# Patient Record
Sex: Female | Born: 1985
Health system: Southern US, Community
[De-identification: ages and names within clinical notes are randomized; demographics above are authoritative.]

## PROBLEM LIST (undated history)

## (undated) DIAGNOSIS — F3181 Bipolar II disorder: Secondary | ICD-10-CM

## (undated) HISTORY — DX: Bipolar II disorder: F31.81

## (undated) HISTORY — PX: INCISION AND DRAINAGE: SHX5863

---

## 2018-06-08 ENCOUNTER — Encounter (HOSPITAL_COMMUNITY): Payer: Self-pay | Admitting: *Deleted

## 2018-06-08 ENCOUNTER — Emergency Department (HOSPITAL_COMMUNITY)
Admission: EM | Admit: 2018-06-08 | Discharge: 2018-06-08 | Disposition: A | Discharge status: Home / Self Care | Payer: Self-pay | Attending: Emergency Medicine | Admitting: Emergency Medicine

## 2018-06-08 ENCOUNTER — Other Ambulatory Visit: Payer: Self-pay

## 2018-06-08 DIAGNOSIS — L0291 Cutaneous abscess, unspecified: Secondary | ICD-10-CM

## 2018-06-08 DIAGNOSIS — L02415 Cutaneous abscess of right lower limb: Secondary | ICD-10-CM | POA: Insufficient documentation

## 2018-06-08 DIAGNOSIS — Z79899 Other long term (current) drug therapy: Secondary | ICD-10-CM | POA: Insufficient documentation

## 2018-06-08 DIAGNOSIS — F17210 Nicotine dependence, cigarettes, uncomplicated: Secondary | ICD-10-CM | POA: Insufficient documentation

## 2018-06-08 MED ORDER — DOXYCYCLINE HYCLATE 100 MG PO CAPS: 100.0000 mg | ORAL_CAPSULE | Freq: Two times a day (BID) | ORAL | 0 refills | Status: DC

## 2018-06-08 MED ORDER — LIDOCAINE-EPINEPHRINE (PF) 2 %-1:200000 IJ SOLN
20.0000 mL | Freq: Once | INTRAMUSCULAR | Status: AC
  Administered 2018-06-08: 20 mL
  Filled 2018-06-08: qty 20

## 2018-06-08 MED ORDER — OXYCODONE-ACETAMINOPHEN 5-325 MG PO TABS
1.0000 | ORAL_TABLET | Freq: Once | ORAL | Status: AC
  Administered 2018-06-08: 1 via ORAL
  Filled 2018-06-08: qty 1

## 2018-06-08 MED ORDER — DOXYCYCLINE HYCLATE 100 MG PO TABS
100.0000 mg | ORAL_TABLET | Freq: Once | ORAL | Status: AC
  Administered 2018-06-08: 100 mg via ORAL
  Filled 2018-06-08: qty 1

## 2018-06-08 NOTE — ED Provider Notes (Signed)
[facility] EMERGENCY DEPARTMENT Provider Note   CSN: [id number] Arrival date & time: 06/08/18  1256   History   Chief Complaint Chief Complaint  Patient presents with  . Abscess    HPI Tiffany Merritt is a 32 y.o. female.  HPI   32 year old female presents today with abscess.Patient notes over the last week she's developed an abscessto her right inner thigh. She notes a history of the same but never in this location. She notes numerous episodes of I&D of her breast for abscess. She denies any fever, reports she has had some drainage from the area. She denies any history of MRSA, no history of history diabetes.    History reviewed. No pertinent past medical history.  There are no active problems to display for this patient.   History reviewed. No pertinent surgical history.   OB History   None      Home Medications    Prior to Admission medications   Medication Sig Start Date End Date Taking? Authorizing Provider  naproxen sodium (ALEVE) 220 MG tablet Take 220 mg by mouth daily as needed (PAIN).   Yes [provider]  doxycycline (VIBRAMYCIN) 100 MG capsule Take 1 capsule (100 mg total) by mouth 2 (two) times daily. 06/08/18   Eyvonne Mechanic, PA-C    Family History History reviewed. No pertinent family history.  Social History Social History   Tobacco Use  . Smoking status: Current Every Day Smoker  Substance Use Topics  . Alcohol use: Yes    Comment: occ  . Drug use: Never     Allergies   Patient has no known allergies.   Review of Systems Review of Systems  All other systems reviewed and are negative.   Physical Exam Updated Vital Signs BP 119/77 (BP Location: Right Arm)   Pulse 77   Temp 98.1 F (36.7 C) (Oral)   Resp 17   SpO2 99%   Physical Exam  Constitutional: She is oriented to person, place, and time. She appears well-developed and well-nourished.  HENT:  Head: Normocephalic and atraumatic.  Eyes:  Pupils are equal, round, and reactive to light. Conjunctivae are normal. Right eye exhibits no discharge. Left eye exhibits no discharge. No scleral icterus.  Neck: Normal range of motion. No JVD present. No tracheal deviation present.  Pulmonary/Chest: Effort normal. No stridor.  Neurological: She is alert and oriented to person, place, and time. Coordination normal.  Skin:  2 cm abscess to right inner thigh- overlying redness no surrounding redness, small amount of drainage noted  Psychiatric: She has a normal mood and affect. Her behavior is normal. Judgment and thought content normal.  Nursing note and vitals reviewed.   ED Treatments / Results  Labs (all labs ordered are listed, but only abnormal results are displayed) Labs Reviewed - No data to display  EKG None  Radiology No results found.  Procedures .Marland KitchenIncision and Drainage Date/Time: 06/08/2018 6:18 PM Performed by: Eyvonne Mechanic, PA-C Authorized by: Eyvonne Mechanic, PA-C   Consent:    Consent obtained:  Verbal   Consent given by:  Patient   Risks discussed:  Bleeding, incomplete drainage, damage to other organs, infection and pain   Alternatives discussed:  No treatment Location:    Type:  Abscess   Size:  2   Location: right inner thigh  Anesthesia (see MAR for exact dosages):    Anesthesia method:  Local infiltration   Local anesthetic:  Lidocaine 2% WITH epi Procedure type:  Complexity:  Simple Procedure details:    Incision types:  Single straight   Incision depth:  Dermal   Scalpel blade:  11   Wound management:  Probed and deloculated and irrigated with saline   Drainage:  Purulent   Drainage amount:  Moderate   Wound treatment:  Wound left open   Packing materials:  None Post-procedure details:    Patient tolerance of procedure:  Tolerated well, no immediate complications   (including critical care time)  Medications Ordered in ED Medications  oxyCODONE-acetaminophen (PERCOCET/ROXICET)  5-325 MG per tablet 1 tablet (1 tablet Oral Given 06/08/18 1620)  lidocaine-EPINEPHrine (XYLOCAINE W/EPI) 2 %-1:200000 (PF) injection 20 mL (20 mLs Infiltration Given 06/08/18 1620)  doxycycline (VIBRA-TABS) tablet 100 mg (100 mg Oral Given 06/08/18 1742)     Initial Impression / Assessment and Plan / ED Course  I have reviewed the triage vital signs and the nursing notes.  Pertinent labs & imaging results that were available during my care of the patient were reviewed by me and considered in my medical decision making (see chart for details).     Labs:   Imaging:  Consults:  Therapeutics:doxycycline   Discharge Meds:  doxycycline  Assessment/Plan: 32 year old female presents today with abscess to her right inner thigh. Getting features, placed on antibiotics, instructed her skin. Patient verbalized understanding and agreement to today's plan.      Final Clinical Impressions(s) / ED Diagnoses   Final diagnoses:  Abscess    ED Discharge Orders        Ordered    doxycycline (VIBRAMYCIN) 100 MG capsule  2 times daily     06/08/18 1731       Zyrah Wiswell, Josie DixonJeffrey, PA-C 06/08/18 1821    Linwood DibblesKnapp, Jon, MD 06/08/18 2056

## 2018-06-08 NOTE — ED Triage Notes (Signed)
Pt reports having a large abscess to her inner thigh for several days. Denies fever but reports drainage and nausea.

## 2018-06-08 NOTE — Discharge Instructions (Addendum)
Please read attached information. If you experience any new or worsening signs or symptoms please return to the emergency room for evaluation. Please follow-up with your primary care provider or specialist as discussed. Please use medication prescribed only as directed and discontinue taking if you have any concerning signs or symptoms.   °

## 2018-09-16 DIAGNOSIS — R091 Pleurisy: Secondary | ICD-10-CM | POA: Diagnosis not present

## 2018-09-16 DIAGNOSIS — R05 Cough: Secondary | ICD-10-CM | POA: Diagnosis not present

## 2018-09-16 DIAGNOSIS — J189 Pneumonia, unspecified organism: Secondary | ICD-10-CM | POA: Diagnosis not present

## 2018-09-16 DIAGNOSIS — R062 Wheezing: Secondary | ICD-10-CM | POA: Diagnosis not present

## 2018-09-19 ENCOUNTER — Other Ambulatory Visit: Payer: Self-pay

## 2018-09-19 ENCOUNTER — Emergency Department (HOSPITAL_BASED_OUTPATIENT_CLINIC_OR_DEPARTMENT_OTHER)
Admission: EM | Admit: 2018-09-19 | Discharge: 2018-09-19 | Disposition: A | Discharge status: Home / Self Care | Payer: 59 | Attending: Emergency Medicine | Admitting: Emergency Medicine

## 2018-09-19 ENCOUNTER — Emergency Department (HOSPITAL_BASED_OUTPATIENT_CLINIC_OR_DEPARTMENT_OTHER): Payer: 59

## 2018-09-19 ENCOUNTER — Encounter (HOSPITAL_BASED_OUTPATIENT_CLINIC_OR_DEPARTMENT_OTHER): Payer: Self-pay | Admitting: *Deleted

## 2018-09-19 DIAGNOSIS — B9789 Other viral agents as the cause of diseases classified elsewhere: Secondary | ICD-10-CM

## 2018-09-19 DIAGNOSIS — F17200 Nicotine dependence, unspecified, uncomplicated: Secondary | ICD-10-CM | POA: Diagnosis not present

## 2018-09-19 DIAGNOSIS — J069 Acute upper respiratory infection, unspecified: Secondary | ICD-10-CM | POA: Diagnosis not present

## 2018-09-19 DIAGNOSIS — R05 Cough: Secondary | ICD-10-CM | POA: Diagnosis not present

## 2018-09-19 DIAGNOSIS — R091 Pleurisy: Secondary | ICD-10-CM | POA: Diagnosis not present

## 2018-09-19 DIAGNOSIS — J189 Pneumonia, unspecified organism: Secondary | ICD-10-CM | POA: Diagnosis not present

## 2018-09-19 DIAGNOSIS — R062 Wheezing: Secondary | ICD-10-CM | POA: Diagnosis not present

## 2018-09-19 MED ORDER — ACETAMINOPHEN 325 MG PO TABS
650.0000 mg | ORAL_TABLET | Freq: Once | ORAL | Status: AC
  Administered 2018-09-19: 650 mg via ORAL
  Filled 2018-09-19: qty 2

## 2018-09-19 NOTE — ED Notes (Signed)
Patient transported to X-ray 

## 2018-09-19 NOTE — ED Triage Notes (Signed)
Pain in her chest for a week. She was diagnosed with pneumonia 3 days ago. [facility] went [facility] to [facility] today for a recheck and was told to come here because she did not sound better. She is ambulatory.

## 2018-09-19 NOTE — ED Notes (Signed)
ED Provider at bedside. 

## 2018-09-19 NOTE — ED Provider Notes (Signed)
[facility] EMERGENCY DEPARTMENT Provider Note   CSN: [id number] Arrival date & time: 09/19/18  1642     History   Chief Complaint Chief Complaint  Patient presents with  . Cough    HPI Tiffany Merritt is a 32 y.o. female.  The history is provided by the patient.  Cough  This is a new problem. The current episode started more than 2 days ago. The problem occurs constantly. The problem has not changed since onset.The cough is non-productive. There has been no fever. The fever has been present for less than 1 day. Associated symptoms include chills and myalgias. Pertinent negatives include no chest pain, no weight loss, no ear congestion, no ear pain, no headaches, no rhinorrhea, no sore throat, no shortness of breath, no wheezing and no eye redness. She has tried decongestants (zpak) for the symptoms. The treatment provided mild relief. Her past medical history is significant for pneumonia (on abx for pneumonia). Her past medical history does not include COPD or asthma.    History reviewed. No pertinent past medical history.  There are no active problems to display for this patient.   History reviewed. No pertinent surgical history.   OB History   None      Home Medications    Prior to Admission medications   Medication Sig Start Date End Date Taking? Authorizing Provider  albuterol (PROVENTIL HFA;VENTOLIN HFA) 108 (90 Base) MCG/ACT inhaler Inhale into the lungs every 6 (six) hours as needed for wheezing or shortness of breath.   Yes [provider]  azithromycin (ZITHROMAX) 250 MG tablet Take by mouth daily.   Yes [provider]  predniSONE (DELTASONE) 10 MG tablet Take 10 mg by mouth daily with breakfast.   Yes [provider]  doxycycline (VIBRAMYCIN) 100 MG capsule Take 1 capsule (100 mg total) by mouth 2 (two) times daily. 06/08/18   Hedges, Tinnie Gens, PA-C  naproxen sodium (ALEVE) 220 MG tablet Take 220 mg by mouth daily as needed  (PAIN).    [provider]    Family History No family history on file.  Social History Social History   Tobacco Use  . Smoking status: Current Every Day Smoker  . Smokeless tobacco: Never Used  Substance Use Topics  . Alcohol use: Yes    Comment: occ  . Drug use: Never     Allergies   Patient has no known allergies.   Review of Systems Review of Systems  Constitutional: Positive for chills. Negative for fever and weight loss.  HENT: Negative for ear pain, rhinorrhea and sore throat.   Eyes: Negative for pain, redness and visual disturbance.  Respiratory: Positive for cough. Negative for shortness of breath and wheezing.   Cardiovascular: Negative for chest pain and palpitations.  Gastrointestinal: Negative for abdominal pain and vomiting.  Genitourinary: Negative for dysuria and hematuria.  Musculoskeletal: Positive for myalgias. Negative for arthralgias and back pain.  Skin: Negative for color change and rash.  Neurological: Negative for seizures, syncope and headaches.  All other systems reviewed and are negative.    Physical Exam Updated Vital Signs BP 135/84   Pulse 90   Temp 98.4 F (36.9 C) (Oral)   Resp 20   Ht 5\' 4"  (1.626 m)   Wt 79.8 kg   LMP 08/19/2018   SpO2 97%   BMI 30.21 kg/m   Physical Exam  Constitutional: She is oriented to person, place, and time. She appears well-developed and well-nourished. No distress.  HENT:  Head: Normocephalic and atraumatic.  Eyes: Pupils are equal, round, and reactive to light. Conjunctivae and EOM are normal.  Neck: Normal range of motion. Neck supple.  Cardiovascular: Normal rate, regular rhythm, normal heart sounds and intact distal pulses.  No murmur heard. Pulmonary/Chest: Effort normal and breath sounds normal. No stridor. No respiratory distress. She has no wheezes.  Abdominal: Soft. There is no tenderness.  Musculoskeletal: Normal range of motion. She exhibits no edema.  Neurological: She is  alert and oriented to person, place, and time.  Skin: Skin is warm and dry.  Psychiatric: She has a normal mood and affect.  Nursing note and vitals reviewed.    ED Treatments / Results  Labs (all labs ordered are listed, but only abnormal results are displayed) Labs Reviewed - No data to display  EKG None  Radiology Dg Chest 2 View  Result Date: 09/19/2018 CLINICAL DATA:  32 year old diagnosed with pneumonia 3 days ago, presenting with worsening cough and chest tightness. Current smoker. EXAM: CHEST - 2 VIEW COMPARISON:  None. FINDINGS: Cardiomediastinal silhouette unremarkable. Lungs clear. Bronchovascular markings normal. Pulmonary vascularity normal. No visible pleural effusions. No pneumothorax. Visualized bony thorax intact. IMPRESSION: Normal examination. Electronically Signed   By: Hulan Saas M.D.   On: 09/19/2018 17:56    Procedures Procedures (including critical care time)  Medications Ordered in ED Medications  acetaminophen (TYLENOL) tablet 650 mg (650 mg Oral Given 09/19/18 1737)     Initial Impression / Assessment and Plan / ED Course  I have reviewed the triage vital signs and the nursing notes.  Pertinent labs & imaging results that were available during my care of the patient were reviewed by me and considered in my medical decision making (see chart for details).     Tiffany Merritt is a 32 year old female with no significant medical history who presents to the ED with cough.  Patient with normal vitals.  No fever.  Patient currently on a Zithromax for pneumonia.  Continues to have cough.  Has been on antibiotics for about 3 days.  Still feels some body aches.  Repeat chest x-ray showed no signs of pneumonia.  No pneumothorax, no pleural effusion.  Patient is overall well-appearing.  Likely ongoing viral process, resolving pneumonia. Recommend continuation of antibiotics, Tylenol, Motrin.  Discharged from ED in good condition.  Understands return  precautions.  Given information for PCP follow-up.  This chart was dictated using voice recognition software.  Despite best efforts to proofread,  errors can occur which can change the documentation meaning.   Final Clinical Impressions(s) / ED Diagnoses   Final diagnoses:  Viral URI with cough    ED Discharge Orders    None       Virgina Norfolk, DO 09/19/18 1819

## 2018-09-20 ENCOUNTER — Encounter (HOSPITAL_COMMUNITY): Payer: Self-pay

## 2018-09-20 ENCOUNTER — Observation Stay (HOSPITAL_COMMUNITY)
Admission: EM | Admit: 2018-09-20 | Discharge: 2018-09-21 | Disposition: A | Discharge status: Home / Self Care | Payer: 59 | Attending: Internal Medicine | Admitting: Internal Medicine

## 2018-09-20 ENCOUNTER — Emergency Department (HOSPITAL_COMMUNITY): Payer: 59

## 2018-09-20 ENCOUNTER — Other Ambulatory Visit: Payer: Self-pay

## 2018-09-20 DIAGNOSIS — F17200 Nicotine dependence, unspecified, uncomplicated: Secondary | ICD-10-CM | POA: Diagnosis not present

## 2018-09-20 DIAGNOSIS — Z79899 Other long term (current) drug therapy: Secondary | ICD-10-CM | POA: Diagnosis not present

## 2018-09-20 DIAGNOSIS — E876 Hypokalemia: Secondary | ICD-10-CM | POA: Insufficient documentation

## 2018-09-20 DIAGNOSIS — I2699 Other pulmonary embolism without acute cor pulmonale: Secondary | ICD-10-CM | POA: Diagnosis not present

## 2018-09-20 DIAGNOSIS — R0781 Pleurodynia: Secondary | ICD-10-CM | POA: Insufficient documentation

## 2018-09-20 DIAGNOSIS — R0902 Hypoxemia: Secondary | ICD-10-CM | POA: Diagnosis not present

## 2018-09-20 DIAGNOSIS — H5319 Other subjective visual disturbances: Secondary | ICD-10-CM

## 2018-09-20 DIAGNOSIS — L818 Other specified disorders of pigmentation: Secondary | ICD-10-CM

## 2018-09-20 DIAGNOSIS — R918 Other nonspecific abnormal finding of lung field: Secondary | ICD-10-CM | POA: Diagnosis not present

## 2018-09-20 DIAGNOSIS — Z720 Tobacco use: Secondary | ICD-10-CM | POA: Diagnosis not present

## 2018-09-20 DIAGNOSIS — D72829 Elevated white blood cell count, unspecified: Secondary | ICD-10-CM | POA: Diagnosis not present

## 2018-09-20 DIAGNOSIS — H538 Other visual disturbances: Secondary | ICD-10-CM

## 2018-09-20 DIAGNOSIS — R202 Paresthesia of skin: Secondary | ICD-10-CM

## 2018-09-20 DIAGNOSIS — I2694 Multiple subsegmental pulmonary emboli without acute cor pulmonale: Secondary | ICD-10-CM | POA: Diagnosis not present

## 2018-09-20 DIAGNOSIS — R000 Tachycardia, unspecified: Secondary | ICD-10-CM | POA: Insufficient documentation

## 2018-09-20 DIAGNOSIS — Z975 Presence of (intrauterine) contraceptive device: Secondary | ICD-10-CM | POA: Insufficient documentation

## 2018-09-20 DIAGNOSIS — Z7901 Long term (current) use of anticoagulants: Secondary | ICD-10-CM | POA: Diagnosis not present

## 2018-09-20 DIAGNOSIS — R079 Chest pain, unspecified: Secondary | ICD-10-CM | POA: Diagnosis not present

## 2018-09-20 DIAGNOSIS — R0789 Other chest pain: Secondary | ICD-10-CM | POA: Diagnosis not present

## 2018-09-20 DIAGNOSIS — I10 Essential (primary) hypertension: Secondary | ICD-10-CM | POA: Diagnosis not present

## 2018-09-20 LAB — CBC
HEMATOCRIT: 45.9 % (ref 36.0–46.0)
Hemoglobin: 15 g/dL (ref 12.0–15.0)
MCH: 30.2 pg (ref 26.0–34.0)
MCHC: 32.7 g/dL (ref 30.0–36.0)
MCV: 92.4 fL (ref 80.0–100.0)
PLATELETS: 223 10*3/uL (ref 150–400)
RBC: 4.97 MIL/uL (ref 3.87–5.11)
RDW: 11.6 % (ref 11.5–15.5)
WBC: 15.7 10*3/uL — AB (ref 4.0–10.5)
nRBC: 0 % (ref 0.0–0.2)

## 2018-09-20 LAB — COMPREHENSIVE METABOLIC PANEL
ALT: 18 U/L (ref 0–44)
ANION GAP: 11 (ref 5–15)
AST: 16 U/L (ref 15–41)
Albumin: 3.8 g/dL (ref 3.5–5.0)
Alkaline Phosphatase: 57 U/L (ref 38–126)
BUN: 7 mg/dL (ref 6–20)
CO2: 26 mmol/L (ref 22–32)
Calcium: 9.4 mg/dL (ref 8.9–10.3)
Chloride: 102 mmol/L (ref 98–111)
Creatinine, Ser: 0.68 mg/dL (ref 0.44–1.00)
Glucose, Bld: 103 mg/dL — ABNORMAL HIGH (ref 70–99)
POTASSIUM: 3.1 mmol/L — AB (ref 3.5–5.1)
Sodium: 139 mmol/L (ref 135–145)
TOTAL PROTEIN: 7.4 g/dL (ref 6.5–8.1)
Total Bilirubin: 0.6 mg/dL (ref 0.3–1.2)

## 2018-09-20 LAB — I-STAT TROPONIN, ED: TROPONIN I, POC: 0 ng/mL (ref 0.00–0.08)

## 2018-09-20 MED ORDER — RIVAROXABAN 20 MG PO TABS: 20.0000 mg | ORAL_TABLET | Freq: Every day | ORAL | Status: DC

## 2018-09-20 MED ORDER — HEPARIN (PORCINE) IN NACL 100-0.45 UNIT/ML-% IJ SOLN
1300.0000 [IU]/h | INTRAMUSCULAR | Status: AC
  Administered 2018-09-20: 1300 [IU]/h via INTRAVENOUS
  Filled 2018-09-20: qty 250

## 2018-09-20 MED ORDER — IOPAMIDOL (ISOVUE-370) INJECTION 76%
100.0000 mL | Freq: Once | INTRAVENOUS | Status: AC | PRN
  Administered 2018-09-20: 100 mL via INTRAVENOUS

## 2018-09-20 MED ORDER — ONDANSETRON HCL 4 MG/2ML IJ SOLN
4.0000 mg | Freq: Once | INTRAMUSCULAR | Status: AC
  Administered 2018-09-20: 4 mg via INTRAVENOUS
  Filled 2018-09-20: qty 2

## 2018-09-20 MED ORDER — POTASSIUM CHLORIDE CRYS ER 20 MEQ PO TBCR
40.0000 meq | EXTENDED_RELEASE_TABLET | ORAL | Status: AC
  Administered 2018-09-20 – 2018-09-21 (×2): 40 meq via ORAL
  Filled 2018-09-20 (×2): qty 2

## 2018-09-20 MED ORDER — IOPAMIDOL (ISOVUE-370) INJECTION 76%
INTRAVENOUS | Status: AC
  Filled 2018-09-20: qty 100

## 2018-09-20 MED ORDER — KETOROLAC TROMETHAMINE 30 MG/ML IJ SOLN
30.0000 mg | Freq: Four times a day (QID) | INTRAMUSCULAR | Status: DC | PRN
  Administered 2018-09-20: 30 mg via INTRAVENOUS
  Filled 2018-09-20: qty 1

## 2018-09-20 MED ORDER — HEPARIN BOLUS VIA INFUSION
4500.0000 [IU] | Freq: Once | INTRAVENOUS | Status: AC
  Administered 2018-09-20: 4500 [IU] via INTRAVENOUS
  Filled 2018-09-20: qty 4500

## 2018-09-20 MED ORDER — FENTANYL CITRATE (PF) 100 MCG/2ML IJ SOLN
50.0000 ug | Freq: Once | INTRAMUSCULAR | Status: AC
  Administered 2018-09-20: 50 ug via INTRAVENOUS
  Filled 2018-09-20: qty 2

## 2018-09-20 MED ORDER — SODIUM CHLORIDE 0.9% FLUSH: 3.0000 mL | Freq: Two times a day (BID) | INTRAVENOUS | Status: DC

## 2018-09-20 MED ORDER — RIVAROXABAN 15 MG PO TABS
15.0000 mg | ORAL_TABLET | Freq: Two times a day (BID) | ORAL | Status: DC
  Administered 2018-09-21: 15 mg via ORAL
  Filled 2018-09-20 (×2): qty 1

## 2018-09-20 NOTE — Progress Notes (Signed)
   09/20/18 2229  Vitals  Temp 97.8 F (36.6 C)  Temp Source Oral  BP 123/79  BP Location Right Arm  Pulse Rate 82  Pulse Rate Source Dinamap  Resp 18  Oxygen Therapy  SpO2 98 %  O2 Device Room Air  Height and Weight  Height 5\' 4"  (1.626 m)  Weight 82.2 kg ( scale a)  Type of Scale Used Standing  BSA (Calculated - sq m) 1.93 sq meters  BMI (Calculated) 31.09  Weight in (lb) to have BMI = 25 145.3  Admitted pt to rm 3E07 from ED, pt alert and oriented, placed on cardiac monitor, CCMD made aware, oriented to room, call bell placed within reach.

## 2018-09-20 NOTE — ED Provider Notes (Signed)
[facility] EMERGENCY DEPARTMENT Provider Note   CSN: [id number] Arrival date & time: 09/20/18  1421     History   Chief Complaint Chief Complaint  Patient presents with  . Chest Pain    HPI Tiffany Merritt is a 32 y.o. female.  Pt presents to the ED today with right sided CP.  The pt said she's had intermittent CP for the last week.  She initially went to urgent care last week who told her she had pna.  She did not feeling like she was getting better and went to [facility]  yesterday.  Repeat CXR showed no pna.  She said she felt ok last night, but sx came back this morning.  She said she has a lot of pain with inspiration.       History reviewed. No pertinent past medical history.  There are no active problems to display for this patient.   History reviewed. No pertinent surgical history.   OB History   None      Home Medications    Prior to Admission medications   Medication Sig Start Date End Date Taking? Authorizing Provider  albuterol (PROVENTIL HFA;VENTOLIN HFA) 108 (90 Base) MCG/ACT inhaler Inhale into the lungs every 6 (six) hours as needed for wheezing or shortness of breath.   Yes [provider]  azithromycin (ZITHROMAX) 250 MG tablet Take 250 mg by mouth daily.    Yes [provider]  predniSONE (DELTASONE) 10 MG tablet Take 10 mg by mouth 4 (four) times daily.    Yes [provider]  promethazine-dextromethorphan (PROMETHAZINE-DM) 6.25-15 MG/5ML syrup Take 7.5 mg by mouth as needed. 09/16/18  Yes [provider]  doxycycline (VIBRAMYCIN) 100 MG capsule Take 1 capsule (100 mg total) by mouth 2 (two) times daily. Patient not taking: Reported on 09/20/2018 06/08/18   Eyvonne Mechanic, PA-C    Family History History reviewed. No pertinent family history.  Social History Social History   Tobacco Use  . Smoking status: Current Every Day Smoker  . Smokeless tobacco: Never Used  Substance Use Topics  .  Alcohol use: Yes    Comment: occ  . Drug use: Never     Allergies   Patient has no known allergies.   Review of Systems Review of Systems  Cardiovascular: Positive for chest pain.  All other systems reviewed and are negative.    Physical Exam Updated Vital Signs BP 113/79   Pulse 74   Temp 97.8 F (36.6 C) (Oral)   Resp 15   Ht 5\' 4"  (1.626 m)   Wt 79.8 kg   SpO2 97%   BMI 30.21 kg/m   Physical Exam  Constitutional: She is oriented to person, place, and time. She appears well-developed and well-nourished.  HENT:  Head: Normocephalic and atraumatic.  Eyes: Pupils are equal, round, and reactive to light. EOM are normal.  Neck: Normal range of motion. Neck supple.  Cardiovascular: Regular rhythm, intact distal pulses and normal pulses. Tachycardia present.  Pulmonary/Chest:    Abdominal: Soft. Bowel sounds are normal.  Musculoskeletal: Normal range of motion.       Right lower leg: Normal.       Left lower leg: Normal.  Neurological: She is alert and oriented to person, place, and time.  Skin: Skin is warm and dry. Capillary refill takes less than 2 seconds.  Psychiatric: She has a normal mood and affect. Her behavior is normal.  Nursing note and vitals reviewed.    ED  Treatments / Results  Labs (all labs ordered are listed, but only abnormal results are displayed) Labs Reviewed  CBC - Abnormal; Notable for the following components:      Result Value   WBC 15.7 (*)    All other components within normal limits  COMPREHENSIVE METABOLIC PANEL - Abnormal; Notable for the following components:   Potassium 3.1 (*)    Glucose, Bld 103 (*)    All other components within normal limits  ANTITHROMBIN III  PROTEIN C ACTIVITY  PROTEIN C, TOTAL  PROTEIN S ACTIVITY  PROTEIN S, TOTAL  LUPUS ANTICOAGULANT PANEL  BETA-2-GLYCOPROTEIN I ABS, IGG/M/A  HOMOCYSTEINE  FACTOR 5 LEIDEN  PROTHROMBIN GENE MUTATION  CARDIOLIPIN ANTIBODIES, IGG, IGM, IGA  HEPARIN LEVEL  (UNFRACTIONATED)  CBC  I-STAT TROPONIN, ED    EKG None  Radiology Dg Chest 2 View  Result Date: 09/19/2018 CLINICAL DATA:  32 year old diagnosed with pneumonia 3 days ago, presenting with worsening cough and chest tightness. Current smoker. EXAM: CHEST - 2 VIEW COMPARISON:  None. FINDINGS: Cardiomediastinal silhouette unremarkable. Lungs clear. Bronchovascular markings normal. Pulmonary vascularity normal. No visible pleural effusions. No pneumothorax. Visualized bony thorax intact. IMPRESSION: Normal examination. Electronically Signed   By: Hulan Saas M.D.   On: 09/19/2018 17:56   Ct Angio Chest Pe W/cm &/or Wo Cm  Result Date: 09/20/2018 CLINICAL DATA:  32 y/o F; chest pain with 1 week of shortness of breath. EXAM: CT ANGIOGRAPHY CHEST WITH CONTRAST TECHNIQUE: Multidetector CT imaging of the chest was performed using the standard protocol during bolus administration of intravenous contrast. Multiplanar CT image reconstructions and MIPs were obtained to evaluate the vascular anatomy. CONTRAST:  ISOVUE-370 IOPAMIDOL (ISOVUE-370) INJECTION 76% COMPARISON:  08/19/2018 chest radiograph FINDINGS: Cardiovascular: Acute lobar and segmental pulmonary emboli bilaterally. RV/LV = 0.7. Normal heart size. No pericardial effusion. Normal caliber thoracic aorta. Mediastinum/Nodes: No enlarged mediastinal, hilar, or axillary lymph nodes. Thyroid gland, trachea, and esophagus demonstrate no significant findings. Lungs/Pleura: Peripheral consolidations in the right upper lobe and right lower lobe with central lucency. No pleural effusion or pneumothorax. Upper Abdomen: No acute abnormality. Musculoskeletal: No chest wall abnormality. No acute or significant osseous findings. Review of the MIP images confirms the above findings. IMPRESSION: 1. Positive for acute lobar and segmental PE, RV/LV = 0.7. 2. Small peripheral consolidations in the right upper lobe and right lower lobe with central lucency, likely  pulmonary infarcts. Critical Value/emergent results were called by telephone at the time of interpretation on 09/20/2018 at 7:17 pm to Dr. Jacalyn Lefevre , who verbally acknowledged these results. Electronically Signed   By: Mitzi Hansen M.D.   On: 09/20/2018 19:19    Procedures Procedures (including critical care time)  Medications Ordered in ED Medications  iopamidol (ISOVUE-370) 76 % injection (has no administration in time range)  heparin bolus via infusion 4,500 Units (has no administration in time range)  heparin ADULT infusion 100 units/mL (25000 units/292mL sodium chloride 0.45%) (has no administration in time range)  fentaNYL (SUBLIMAZE) injection 50 mcg (50 mcg Intravenous Given 09/20/18 1624)  ondansetron (ZOFRAN) injection 4 mg (4 mg Intravenous Given 09/20/18 1623)  iopamidol (ISOVUE-370) 76 % injection 100 mL (100 mLs Intravenous Contrast Given 09/20/18 1855)     Initial Impression / Assessment and Plan / ED Course  I have reviewed the triage vital signs and the nursing notes.  Pertinent labs & imaging results that were available during my care of the patient were reviewed by me and considered in my medical decision  making (see chart for details).    Pt's pain has improved.  She has not had an oxygen requirement.  The pt has multiple PE with infarct.  No right heart strain and troponin nl.  She has never had a PE or DVT.  She does not have a family hx of blood clots.  She has an IUD, no BCPs.  Pt d/w IMTS who will admit.   Final Clinical Impressions(s) / ED Diagnoses   Final diagnoses:  Multiple subsegmental pulmonary emboli without acute cor pulmonale  Pulmonary infarct [facility])    ED Discharge Orders    None       Jacalyn Lefevre, MD 09/20/18 1941

## 2018-09-20 NOTE — ED Triage Notes (Signed)
Pt arrives to ED from home with complaints of chest pain x1 week. EMS reports pt was seen at [facility] yesterday, has recent pneumonia dx. Pt was given abx at home, states pain has increased and is worse with deep inspiration. VSS, sinus tach upon arrival. Pt placed in position of comfort with bed locked and lowered, call bell in reach.

## 2018-09-20 NOTE — ED Notes (Addendum)
ED Provider at bedside discussing results of CT

## 2018-09-20 NOTE — Progress Notes (Addendum)
ANTICOAGULATION CONSULT NOTE - Consult  Pharmacy Consult for Xarelto Indication: pulmonary embolus  No Known Allergies  Patient Measurements: Height: 5\' 4"  (162.6 cm) Weight: 176 lb (79.8 kg) IBW/kg (Calculated) : 54.7 Heparin Dosing Weight: 71.8   Vital Signs: Temp: 97.8 F (36.6 C) (11/05 1427) Temp Source: Oral (11/05 1427) BP: 113/79 (11/05 1745) Pulse Rate: 74 (11/05 1745)  Labs: Recent Labs    09/20/18 1621  HGB 15.0  HCT 45.9  PLT 223  CREATININE 0.68    Estimated Creatinine Clearance: 105 mL/min (by C-G formula based on SCr of 0.68 mg/dL).   Medical History: History reviewed. No pertinent past medical history.   Assessment: Patient is 89 yoF presenting with chest pain found to have an acute lobar and segmental PE. Patient was initially started on heparin in the ED.  Pharmacy has been consulted for Xarelto dosing to begin tomorrow. No anticoagulation PTA. HgB and PLT wnl.     Goal of Therapy:  Monitor platelets by anticoagulation protocol: Yes   Plan:  Stop Heparin 0800 tomorrow  Start at 0800 Xarelto 15 mg PO BID for 3 weeks, then 20 mg PO QD  Monitor for CBC and s/sx of bleed  Chauncey Mann, Pharmacy Student

## 2018-09-20 NOTE — H&P (Signed)
Date: 09/20/2018               Patient Name:  Tiffany Merritt MRN: [id number]  DOB: 1985/12/02 Age / Sex: 32 y.o., female   PCP: Patient, No Pcp Per         Medical Service: Internal Medicine Teaching Service         Attending Physician: Dr. Sandre Kitty    First Contact: Dr. Maryla Morrow Pager: [phone]  Second Contact: Dr. Caron Presume Pager: [phone]       After Hours (After 5p/  First Contact Pager: [phone]  weekends / holidays): Second Contact Pager: [phone]   Chief Complaint: Chest pain   History of Present Illness: Tiffany Merritt is a 32 year old otherwise healthy woman presenting for evaluation of right-sided chest pain.    She was in her usual state of health until 1 week ago when she began developing flulike symptoms with chills, diaphoresis, nausea, nonbloody nonbilious emesis and right-sided chest pain.  She was evaluated at an urgent care and states her work-up was consistent with pneumonia and pleurisy.  She was started on prednisone, azithromycin and an inhaler with no relief.  She reports her right-sided chest pain worsened yesterday, radiated to her right neck with associated shortness of breath and palpitation.  The pain remained pleuritic in nature.  She was subsequently evaluated at [facility] emergency department and was given a presumed diagnosis of upper respiratory viral infection.  She denies headache, dizziness, lightheadedness, personal or family history of DVT, lower extremity pain, lower extremity swelling, long periods of immobility, OCP use though she has an IUD in place, unintentional weight loss.   She does report that 2 years ago she began a work-up for multiple sclerosis when she began experiencing intermittent paresthesia of the face, hand and lower extremity, photopsia and blurry vision.  She states that due to cost, she was unable to obtain an MRI.  Patient was evaluated at [facility] ED with complaints of cough, chills and myalgia.  She received a presumed diagnosis  of upper respiratory infection and was instructed to follow-up with PCP.  ED course: Afebrile, tachycardic to 115, RR 16, BP initially elevated at 144/93, O2 saturation of 96% on room air.  CBC showed leukocytosis of 15.7, CMP reveals hypokalemia with K+ of 3.1, i-STAT troponin negative, EKG from 11/4 showed T wave inversions in the inferior leads, CT angiography chest was positive for acute lobar and segmental pulmonary embolus, small peripheral consolidation in the right upper lobe and right lower lobe most likely pulmonary infarct.  Meds:  Current Meds  Medication Sig  . albuterol (PROVENTIL HFA;VENTOLIN HFA) 108 (90 Base) MCG/ACT inhaler Inhale into the lungs every 6 (six) hours as needed for wheezing or shortness of breath.  Marland Kitchen azithromycin (ZITHROMAX) 250 MG tablet Take 250 mg by mouth daily.   . predniSONE (DELTASONE) 10 MG tablet Take 10 mg by mouth 4 (four) times daily.   . promethazine-dextromethorphan (PROMETHAZINE-DM) 6.25-15 MG/5ML syrup Take 7.5 mg by mouth as needed.     Allergies: Allergies as of 09/20/2018  . (No Known Allergies)   History reviewed. No pertinent past medical history.  Family History: Mother with lupus, grandma with COPD.    Social History:  -Smokes 4 to 5 pieces of cigarettes a day since age 77, drinks alcohol socially, denies IV drug use or any use of illicit drugs.   -No recent travel   Review of Systems: A complete ROS was negative except as per HPI.  Physical Exam: Blood pressure 113/79, pulse 74, temperature 97.8 F (36.6 C), temperature source Oral, resp. rate 15, height 5\' 4"  (1.626 m), weight 79.8 kg, SpO2 97 %.  Physical Exam  Constitutional: She is well-developed, well-nourished, and in no distress. No distress.  HENT:  Head: Normocephalic and atraumatic.  Neck: Neck supple.  Cardiovascular: Normal rate, regular rhythm and normal heart sounds. Exam reveals no gallop and no friction rub.  No murmur heard. Pulmonary/Chest: Effort  normal and breath sounds normal. No respiratory distress. She has no wheezes. She has no rales.  Abdominal: Soft. Bowel sounds are normal. She exhibits no distension. There is no tenderness.  Musculoskeletal: Normal range of motion. She exhibits no edema, tenderness or deformity.  Neurological: She is alert.  Skin: Skin is warm. She is not diaphoretic.  Multiple tattoos noted  Psychiatric: Mood, memory, affect and judgment normal.    EKG: 11/04 with T wave inversions in the inferior leads  CXR: personally reviewed my interpretation is unremarkable  Assessment & Plan by Problem: Active Problems:   PE (pulmonary thromboembolism) (HCC)  Tiffany Merritt is a 32 year old otherwise healthy woman here for management of lobar and segmental pulmonary embolism.  Unprovoked pulmonary embolism: Patient with 1 week history of right-sided pleuritic chest pain with radiation to right neck, shortness of breath and palpitation. Denies personal or family history of DVTs although mother has lupus, recent travels, prolonged immobility, recent weight loss.  She is a current smoker and has IUD in place.  On arrival, she remained hemodynamically stable.  Physical exam reassuring with no evidence of lower extremity edema, pain or erythema.  EKG from 11/04 reviewed T wave inversion in inferior leads. CT angiography chest positive for acute lobar and segmental pulmonary embolism with no evidence of right heart strain. - Started on IV heparin infusion in the ED - Monitor heparin level, H&H, platelets - Can transition to DOAC in a.m. as she remains hemodynamically stable - I presume she will need oral anticoagulation for at least 3 to 6 months. - Follow-up anticoagulation panel: Cardiolipin antibodies, prothrombin gene mutation, factor V Leyden, homocystine, beta-2 glycoprotein, lupus anticoagulant, protein S, protein C, Antithrombin III - Follow-up beta-hCG, troponin, EKG, urinalysis - Continuous cardiac pulse ox  monitor.  Leukocytosis: Most likely from demargination as she recently started prednisone  Hypokalemia: K+ of 3.1.  Repleted -Follow-up a.m. BMP  Suspicion of multiple sclerosis: Reports of a 2-year history of intermittent paresthesia of the face, hand and lower extremity, photopsia and blurry vision.  States she began work-up in [place] 2 years ago with blood work but could not afford MRI.  FEN: Replace electrolytes as needed, regular diet DVT prophylaxis: IV heparin CODE STATUS: Full code  Dispo: Admit patient to Observation with expected length of stay less than 2 midnights.  Signed: Yvette Rack, MD 09/20/2018, 8:33 PM  Pager: [phone] IMTS PGY-1

## 2018-09-20 NOTE — Progress Notes (Signed)
ANTICOAGULATION CONSULT NOTE - Initial Consult  Pharmacy Consult for Heparin Indication: pulmonary embolus  No Known Allergies  Patient Measurements: Height: 5\' 4"  (162.6 cm) Weight: 176 lb (79.8 kg) IBW/kg (Calculated) : 54.7 Heparin Dosing Weight: 71.8   Vital Signs: Temp: 97.8 F (36.6 C) (11/05 1427) Temp Source: Oral (11/05 1427) BP: 113/79 (11/05 1745) Pulse Rate: 74 (11/05 1745)  Labs: Recent Labs    09/20/18 1621  HGB 15.0  HCT 45.9  PLT 223  CREATININE 0.68    Estimated Creatinine Clearance: 105 mL/min (by C-G formula based on SCr of 0.68 mg/dL).   Medical History: History reviewed. No pertinent past medical history.   Assessment: Patient is 73 yoF presenting with chest pain found to have an acute lobar and segmental PE. Pharmacy has been consulted for heparin dosing. No anticoagulation PTA. HgB and PLT wnl.     Goal of Therapy:  Heparin level 0.3-0.7 units/ml Monitor platelets by anticoagulation protocol: Yes   Plan:  Give 4500 units bolus x 1 Start heparin infusion at 1300 units/hr Check anti-Xa level in 6 hours and daily while on heparin Continue to monitor H&H and platelets  Monitor for s/sx of bleed  Chauncey Mann, Pharmacy Student

## 2018-09-21 DIAGNOSIS — I2699 Other pulmonary embolism without acute cor pulmonale: Secondary | ICD-10-CM | POA: Diagnosis not present

## 2018-09-21 DIAGNOSIS — Z7901 Long term (current) use of anticoagulants: Secondary | ICD-10-CM

## 2018-09-21 DIAGNOSIS — R918 Other nonspecific abnormal finding of lung field: Secondary | ICD-10-CM | POA: Diagnosis not present

## 2018-09-21 DIAGNOSIS — R202 Paresthesia of skin: Secondary | ICD-10-CM | POA: Diagnosis not present

## 2018-09-21 DIAGNOSIS — Z79899 Other long term (current) drug therapy: Secondary | ICD-10-CM | POA: Diagnosis not present

## 2018-09-21 DIAGNOSIS — Z720 Tobacco use: Secondary | ICD-10-CM | POA: Diagnosis not present

## 2018-09-21 DIAGNOSIS — R000 Tachycardia, unspecified: Secondary | ICD-10-CM | POA: Diagnosis not present

## 2018-09-21 DIAGNOSIS — D72829 Elevated white blood cell count, unspecified: Secondary | ICD-10-CM | POA: Diagnosis not present

## 2018-09-21 DIAGNOSIS — E876 Hypokalemia: Secondary | ICD-10-CM | POA: Diagnosis not present

## 2018-09-21 DIAGNOSIS — H5319 Other subjective visual disturbances: Secondary | ICD-10-CM | POA: Diagnosis not present

## 2018-09-21 DIAGNOSIS — I2694 Multiple subsegmental pulmonary emboli without acute cor pulmonale: Secondary | ICD-10-CM | POA: Diagnosis not present

## 2018-09-21 DIAGNOSIS — F17200 Nicotine dependence, unspecified, uncomplicated: Secondary | ICD-10-CM | POA: Diagnosis not present

## 2018-09-21 DIAGNOSIS — H538 Other visual disturbances: Secondary | ICD-10-CM | POA: Diagnosis not present

## 2018-09-21 DIAGNOSIS — Z975 Presence of (intrauterine) contraceptive device: Secondary | ICD-10-CM | POA: Diagnosis not present

## 2018-09-21 DIAGNOSIS — R0781 Pleurodynia: Secondary | ICD-10-CM | POA: Diagnosis not present

## 2018-09-21 DIAGNOSIS — L818 Other specified disorders of pigmentation: Secondary | ICD-10-CM | POA: Diagnosis not present

## 2018-09-21 LAB — HEPARIN LEVEL (UNFRACTIONATED): HEPARIN UNFRACTIONATED: 0.31 [IU]/mL (ref 0.30–0.70)

## 2018-09-21 LAB — URINALYSIS, ROUTINE W REFLEX MICROSCOPIC
Bilirubin Urine: NEGATIVE
Glucose, UA: NEGATIVE mg/dL
Hgb urine dipstick: NEGATIVE
Ketones, ur: NEGATIVE mg/dL
Nitrite: NEGATIVE
Protein, ur: 30 mg/dL — AB
Specific Gravity, Urine: >1.046 — ABNORMAL HIGH (ref 1.005–1.030)
pH: 6 (ref 5.0–8.0)

## 2018-09-21 LAB — TROPONIN I: Troponin I: <0.03 ng/mL (ref <0.03)

## 2018-09-21 LAB — HCG, SERUM, QUALITATIVE: Preg, Serum: NEGATIVE

## 2018-09-21 LAB — ANTITHROMBIN III: ANTITHROMB III FUNC: 111 % (ref 75–120)

## 2018-09-21 LAB — CBC
HCT: 40.3 % (ref 36.0–46.0)
Hemoglobin: 13.3 g/dL (ref 12.0–15.0)
MCH: 30.9 pg (ref 26.0–34.0)
MCHC: 33 g/dL (ref 30.0–36.0)
MCV: 93.7 fL (ref 80.0–100.0)
Platelets: 174 10*3/uL (ref 150–400)
RBC: 4.3 MIL/uL (ref 3.87–5.11)
RDW: 11.8 % (ref 11.5–15.5)
WBC: 9.8 10*3/uL (ref 4.0–10.5)
nRBC: 0 % (ref 0.0–0.2)

## 2018-09-21 LAB — HIV ANTIBODY (ROUTINE TESTING W REFLEX): HIV SCREEN 4TH GENERATION: NONREACTIVE

## 2018-09-21 LAB — BASIC METABOLIC PANEL
Anion gap: 9 (ref 5–15)
BUN: 8 mg/dL (ref 6–20)
CHLORIDE: 101 mmol/L (ref 98–111)
CO2: 28 mmol/L (ref 22–32)
Calcium: 8.8 mg/dL — ABNORMAL LOW (ref 8.9–10.3)
Creatinine, Ser: 0.92 mg/dL (ref 0.44–1.00)
GFR calc Af Amer: >60 mL/min (ref >60)
GFR calc non Af Amer: >60 mL/min (ref >60)
GLUCOSE: 114 mg/dL — AB (ref 70–99)
POTASSIUM: 4 mmol/L (ref 3.5–5.1)
Sodium: 138 mmol/L (ref 135–145)

## 2018-09-21 MED ORDER — IBUPROFEN 800 MG PO TABS
800.0000 mg | ORAL_TABLET | Freq: Three times a day (TID) | ORAL | Status: DC | PRN
  Administered 2018-09-21: 800 mg via ORAL
  Filled 2018-09-21 (×4): qty 1

## 2018-09-21 MED ORDER — HYDROMORPHONE HCL 1 MG/ML IJ SOLN
0.5000 mg | INTRAMUSCULAR | Status: DC | PRN
  Administered 2018-09-21: 0.5 mg via INTRAVENOUS
  Filled 2018-09-21: qty 0.5

## 2018-09-21 MED ORDER — OXYCODONE HCL 5 MG PO TABS: 5.0000 mg | ORAL_TABLET | ORAL | Status: DC | PRN

## 2018-09-21 MED ORDER — OXYCODONE HCL 5 MG PO TABS: 5.0000 mg | ORAL_TABLET | ORAL | 0 refills | Status: AC | PRN

## 2018-09-21 MED ORDER — IBUPROFEN 600 MG PO TABS
800.0000 mg | ORAL_TABLET | Freq: Three times a day (TID) | ORAL | Status: DC
  Filled 2018-09-21: qty 1

## 2018-09-21 MED ORDER — RIVAROXABAN 15 MG PO TABS: 15.0000 mg | ORAL_TABLET | Freq: Two times a day (BID) | ORAL | 0 refills | Status: DC

## 2018-09-21 MED ORDER — IBUPROFEN 800 MG PO TABS: 800.0000 mg | ORAL_TABLET | Freq: Three times a day (TID) | ORAL | 0 refills | Status: AC | PRN

## 2018-09-21 MED ORDER — RIVAROXABAN 20 MG PO TABS: 20.0000 mg | ORAL_TABLET | Freq: Every day | ORAL | 0 refills | Status: DC

## 2018-09-21 MED FILL — oxyCODONE HCL 5 MG TABS: 5 | 5 days supply | Qty: 30 | Fill #0

## 2018-09-21 MED FILL — IBUPROFEN 800 MG TAB: 800 | 10 days supply | Qty: 30 | Fill #0

## 2018-09-21 MED FILL — XARELTO 15 MG TABLET: 15 | 21 days supply | Qty: 42 | Fill #0

## 2018-09-21 NOTE — Progress Notes (Signed)
Called the [facility] outpatient pharmacy   Xarelto: Starter pack is $10 with insurance and coupon, $30 with insurance and coupon for 90 day supply.   Eliquis: First month $10, $60 without insurance or $0 with insurance and coupon.   Will start Xarelto. Plan for discharge.

## 2018-09-21 NOTE — Discharge Instructions (Signed)
Thank you for allowing [place] to provide your medical care. We have sent your prescriptions to the [facility]. It is important that you take the Rivaroxaban (Xarelto) as prescribed and do not miss a dose. [facility] will be calling you to schedule a follow-up appointment within the next 5-7 days. For pain control we have sent out a prescription for ibuprofen and oxy IR, please take these medications as prescribed. If any issues with your pain or medication arise please do not hesitate to call the clinic at [phone].   Pulmonary Embolism A pulmonary embolism (PE) is a sudden blockage or decrease of blood flow in one lung or both lungs. Most blockages come from a blood clot that forms in a lower leg, thigh, or arm vein (deep vein thrombosis, DVT) and travels to the lungs. A clot is blood that has thickened into a gel or solid. PE is a dangerous and life-threatening condition that needs to be treated right away. What are the causes? This condition is usually caused by a blood clot that forms in a vein and moves to the lungs. In rare cases, it may be caused by air, fat, part of a tumor, or other tissue that moves through the veins and into the lungs. What increases the risk? The following factors may make you more likely to develop this condition:  Having DVT or a history of DVT.  Being older than age 55.  Personal or family history of blood clots or blood clotting disease.  Major or lengthy surgery.  Orthopedic surgery, especially hip or knee replacement.  Traumatic injury, such as breaking a hip or leg.  Spinal cord injury.  Stroke.  Taking medicines that contain estrogen. These include birth control pills and hormone replacement therapy.  Long-term (chronic) lung or heart disease.  Cancer and chemotherapy.  Having a central venous catheter.  Pregnancy and the period after delivery.  What are the signs or symptoms? Symptoms of this  condition usually start suddenly and include:  Shortness of breath while active or at rest.  Coughing or coughing up blood or blood-tinged mucus.  Chest pain that is often worse with deep breaths.  Rapid or irregular heartbeat.  Feeling light-headed or dizzy.  Fainting.  Feeling anxious.  Sweating.  Pain and swelling in a leg. This is a symptom of DVT, which can lead to PE.  How is this diagnosed? This condition may be diagnosed based on:  Your medical history.  A physical exam.  Blood tests to check blood oxygen level and how well your blood clots, and a D-dimer blood test, which checks your blood for a substance that is released when a blood clot breaks apart.  CT pulmonary angiogram. This test checks blood flow in and around your lungs.  Ventilation-perfusion scan, also called a lung VQ scan. This test measures air flow and blood flow to the lungs.  Ultrasound of the legs to look for blood clots.  How is this treated? Treatment for this conditions depends on many factors, such as the cause of your PE, your risk for bleeding or developing more clots, and other medical conditions you have. Treatment aims to remove, dissolve, or stop blood clots from forming or growing larger. Treatment may include:  Blood thinning medicines (anticoagulants) to stop clots from forming or growing. These medicines may be given as a pill, as an injection, or through an IV tube (infusion).  Medicines that dissolve clots (thrombolytics).  A procedure in which  a flexible tube is used to remove a blood clot (embolectomy) or deliver medicine to destroy it (catheter-directed thrombolysis).  A procedure in which a filter is inserted into a large vein that carries blood to the heart (inferior vena cava). This filter (vena cava filter) catches blood clots before they reach the lungs.  Surgery to remove the clot (surgical embolectomy). This is rare.  You may need a combination of immediate,  long-term (up to 3 months after diagnosis), and extended (more than 3 months after diagnosis) treatments. Your treatment may continue for several months (maintenance therapy). You and your health care provider will work together to choose the treatment program that is best for you. Follow these instructions at home: If you are taking an anticoagulant medicine:  Take the medicine every day at the same time each day.  Understand what foods and drugs interact with your medicine.  Understand the side effects of this medicine, including excessive bruising or bleeding. Ask your health care provider or pharmacist about other side effects. General instructions  Take over-the-counter and prescription medicines only as told by your health care provider.  Anticoagulant medicines may cause side effects, including easy bruising and difficulty stopping bleeding. If you are prescribed an anticoagulant: ? Hold pressure over cuts for longer than usual. ? Tell your dentist and other health care providers that you are taking anticoagulants before you have any procedure that may cause bleeding. ? Avoid contact sports. ? Be extra careful when handling sharp objects. ? Use a soft toothbrush. Floss with waxed dental floss. ? Shave with an Neurosurgeon.  Wear a medical alert bracelet or carry a medical alert card that says you have had a PE.  Ask your health care provider when you may return to your normal activities.  Talk with your health care provider about any travel plans. It is important to make sure that you are still able to take your medicine while on trips.  Keep all follow-up visits as told by your health care provider. This is important. How is this prevented? Take these actions to lower your risk of developing another PE:  Exercise regularly. Take frequent walks. For at least 30 minutes every day, engage in: ? Activity that involves moving your arms and legs. ? Activity that encourages good  blood flow through your body by increasing your heart rate.  While traveling, drink plenty of water and avoid drinking alcohol. Ask your health care provider if you should wear below-the-knee compression stockings.  Avoid sitting or lying in bed for long periods of time without moving your legs. Exercise your arms and legs every hour during long-distance travel (over 4 hours).  If you are hospitalized or have surgery, ask your health care provider about your risks and what treatments can help prevent blood clots.  Maintain a healthy weight. Ask your health care provider what weight is healthy for you.  If you are a woman who is over age 70, avoid unnecessary use of medicines that contain estrogen, including birth control pills.  Do not use any products that contain nicotine or tobacco, such as cigarettes and e-cigarettes. This is especially important if you take estrogen medicines. If you need help quitting, ask your health care provider.  See your health care provider for regular checkups. This may include blood tests and ultrasound testing on your legs to check for new blood clots.  Contact a health care provider if:  You missed a dose of your blood thinner medicine. Get help right  away if:  You have new or increased pain, swelling, warmth, or redness in an arm or leg.  You have numbness or tingling in an arm or leg.  You have shortness of breath while active or at rest.  You have chest pain.  You have a rapid or irregular heartbeat.  You feel light-headed or dizzy.  You cough up blood.  You have blood in your vomit, stool, or urine.  You have a fever.  You have abdomen (abdominal) pain.  You have a severe fall or head injury.  You have a severe headache.  You have vision changes.  You cannot move your arms or legs.  You are confused or have memory loss.  You are bleeding for 10 minutes or more, even with strong pressure on the wound. These symptoms may represent  a serious problem that is an emergency. Do not wait to see if the symptoms will go away. Get medical help right away. Call your local emergency services (911 in the [place] Do not drive yourself to the hospital. Summary  A pulmonary embolism (PE) is a sudden blockage or decrease of blood flow in one lung or both lungs. PE is a dangerous and life-threatening condition that needs to be treated right away.  Having deep vein thrombosis (DVT) or a history of DVT is the most common risk factor for PE.  Treatments for this condition usually include medicines to thin your blood (anticoagulants) or medicines to break apart blood clots (thrombolytics).  If you are prescribed blood thinners, it is important to take the medicine every single day at the same time each day.  If you have signs of PE or DVT, call your local emergency services (911 in the [place] This information is not intended to replace advice given to you by your health care provider. Make sure you discuss any questions you have with your health care provider. Document Released: 10/30/2000 Document Revised: 12/05/2016 Document Reviewed: 12/05/2016 Elsevier Interactive Patient Education  2018 [employer].  Rivaroxaban oral tablets What is this medicine? RIVAROXABAN (ri va ROX a ban) is an anticoagulant (blood thinner). It is used to treat blood clots in the lungs or in the veins. It is also used after knee or hip surgeries to prevent blood clots. It is also used to lower the chance of stroke in people with a medical condition called atrial fibrillation. This medicine may be used for other purposes; ask your health care provider or pharmacist if you have questions. COMMON BRAND NAME(S): Xarelto, Xarelto Starter Pack What should I tell my health care provider before I take this medicine? They need to know if you have any of these conditions: -bleeding disorders -bleeding in the brain -blood in your stools (black or tarry stools) or if you  have blood in your vomit -history of stomach bleeding -kidney disease -liver disease -low blood counts, like low white cell, platelet, or red cell counts -recent or planned spinal or epidural procedure -take medicines that treat or prevent blood clots -an unusual or allergic reaction to rivaroxaban, other medicines, foods, dyes, or preservatives -pregnant or trying to get pregnant -breast-feeding How should I use this medicine? Take this medicine by mouth with a glass of water. Follow the directions on the prescription label. Take your medicine at regular intervals. Do not take it more often than directed. Do not stop taking except on your doctor's advice. Stopping this medicine may increase your risk of a blood clot. Be sure to refill your prescription before  you run out of medicine. If you are taking this medicine after hip or knee replacement surgery, take it with or without food. If you are taking this medicine for atrial fibrillation, take it with your evening meal. If you are taking this medicine to treat blood clots, take it with food at the same time each day. If you are unable to swallow your tablet, you may crush the tablet and mix it in applesauce. Then, immediately eat the applesauce. You should eat more food right after you eat the applesauce containing the crushed tablet. Talk to your pediatrician regarding the use of this medicine in children. Special care may be needed. Overdosage: If you think you have taken too much of this medicine contact a poison control center or emergency room at once. NOTE: This medicine is only for you. Do not share this medicine with others. What if I miss a dose? If you take your medicine once a day and miss a dose, take the missed dose as soon as you remember. If you take your medicine twice a day and miss a dose, take the missed dose immediately. In this instance, 2 tablets may be taken at the same time. The next day you should take 1 tablet twice a day  as directed. What may interact with this medicine? Do not take this medicine with any of the following medications: -defibrotide This medicine may also interact with the following medications: -aspirin and aspirin-like medicines -certain antibiotics like erythromycin, azithromycin, and clarithromycin -certain medicines for fungal infections like ketoconazole and itraconazole -certain medicines for irregular heart beat like amiodarone, quinidine, dronedarone -certain medicines for seizures like carbamazepine, phenytoin -certain medicines that treat or prevent blood clots like warfarin, enoxaparin, and dalteparin -conivaptan -diltiazem -felodipine -indinavir -lopinavir; ritonavir -NSAIDS, medicines for pain and inflammation, like ibuprofen or naproxen -ranolazine -rifampin -ritonavir -SNRIs, medicines for depression, like desvenlafaxine, duloxetine, levomilnacipran, venlafaxine -SSRIs, medicines for depression, like citalopram, escitalopram, fluoxetine, fluvoxamine, paroxetine, sertraline -St. John's wort -verapamil This list may not describe all possible interactions. Give your health care provider a list of all the medicines, herbs, non-prescription drugs, or dietary supplements you use. Also tell them if you smoke, drink alcohol, or use illegal drugs. Some items may interact with your medicine. What should I watch for while using this medicine? Visit your doctor or health care professional for regular checks on your progress. Notify your doctor or health care professional and seek emergency treatment if you develop breathing problems; changes in vision; chest pain; severe, sudden headache; pain, swelling, warmth in the leg; trouble speaking; sudden numbness or weakness of the face, arm or leg. These can be signs that your condition has gotten worse. If you are going to have surgery or other procedure, tell your doctor that you are taking this medicine. What side effects may I notice  from receiving this medicine? Side effects that you should report to your doctor or health care professional as soon as possible: -allergic reactions like skin rash, itching or hives, swelling of the face, lips, or tongue -back pain -redness, blistering, peeling or loosening of the skin, including inside the mouth -signs and symptoms of bleeding such as bloody or black, tarry stools; red or dark-brown urine; spitting up blood or brown material that looks like coffee grounds; red spots on the skin; unusual bruising or bleeding from the eye, gums, or nose Side effects that usually do not require medical attention (report to your doctor or health care professional if they continue or are bothersome): -  dizziness -muscle pain This list may not describe all possible side effects. Call your doctor for medical advice about side effects. You may report side effects to FDA at 1-800-FDA-1088. Where should I keep my medicine? Keep out of the reach of children. Store at room temperature between 15 and 30 degrees C (59 and 86 degrees F). Throw away any unused medicine after the expiration date. NOTE: This sheet is a summary. It may not cover all possible information. If you have questions about this medicine, talk to your doctor, pharmacist, or health care provider.  2018 Elsevier/Gold Standard (2016-07-22 16:29:33)  =====================================================================================  Information on my medicine - XARELTO (rivaroxaban)  WHY WAS XARELTO PRESCRIBED FOR YOU? Xarelto was prescribed to treat blood clots that may have been found in the veins of your legs (deep vein thrombosis) or in your lungs (pulmonary embolism) and to reduce the risk of them occurring again.  What do you need to know about Xarelto? The starting dose is one 15 mg tablet taken TWICE daily with food for the FIRST 21 DAYS then on (enter date)  10/12/18  the dose is changed to one 20 mg tablet taken ONCE A  DAY with your evening meal.  DO NOT stop taking Xarelto without talking to the health care provider who prescribed the medication.  Refill your prescription for 20 mg tablets before you run out.  After discharge, you should have regular check-up appointments with your healthcare provider that is prescribing your Xarelto.  In the future your dose may need to be changed if your kidney function changes by a significant amount.  What do you do if you miss a dose? If you are taking Xarelto TWICE DAILY and you miss a dose, take it as soon as you remember. You may take two 15 mg tablets (total 30 mg) at the same time then resume your regularly scheduled 15 mg twice daily the next day.  If you are taking Xarelto ONCE DAILY and you miss a dose, take it as soon as you remember on the same day then continue your regularly scheduled once daily regimen the next day. Do not take two doses of Xarelto at the same time.   Important Safety Information Xarelto is a blood thinner medicine that can cause bleeding. You should call your healthcare provider right away if you experience any of the following: ? Bleeding from an injury or your nose that does not stop. ? Unusual colored urine (red or dark brown) or unusual colored stools (red or black). ? Unusual bruising for unknown reasons. ? A serious fall or if you hit your head (even if there is no bleeding).  Some medicines may interact with Xarelto and might increase your risk of bleeding while on Xarelto. To help avoid this, consult your healthcare provider or pharmacist prior to using any new prescription or non-prescription medications, including herbals, vitamins, non-steroidal anti-inflammatory drugs (NSAIDs) and supplements.  This website has more information on Xarelto: [email or web address].br.

## 2018-09-21 NOTE — Progress Notes (Signed)
Patient resting comfortably during shift report. Denies complaints.  

## 2018-09-21 NOTE — Progress Notes (Signed)
   Subjective: Patient is feeling well this AM but did not get much sleep last night. Denies SOB. Endorses pleuritic chest pain rated 5/10 and appears to have periods of worsening pain. She works in the registration area of the ED. Denies history of miscarriage. Limited family history due to adoption but unaware of family history of DVT/PE. Discussed the treatment of VTE/PE, provoked vs unprovoked. We are uncertain in regards to what caused this clot; however, we do have a hypercoagulable panel pending. Discussed the risks/benefits of anticoagulation. We discussed the plan for discharge and close outpatient follow-up. All questions and concerns addressed.   Objective: Vital signs in last 24 hours: Vitals:   09/20/18 2000 09/20/18 2030 09/20/18 2229 09/21/18 0542  BP: 127/88 116/71 123/79 113/66  Pulse:   82 80  Resp: 16 14 18 18   Temp:   97.8 F (36.6 C) 97.8 F (36.6 C)  TempSrc:   Oral Oral  SpO2:   98% 98%  Weight:   82.2 kg 83.1 kg  Height:   5\' 4"  (1.626 m)    General: Obese female in no acute distress Pulm: Good air movement with no wheezing or crackles  CV: RRR, no murmurs, no rubs  Abdomen: Soft, non-distended, no tenderness to palpation  Extremities: No LE edema   Assessment/Plan:  [facility] is a 32 y.o female with an insignificant past medical history who presented to the ED with 1 week of progressive pleuritic chest pain. She was subsequently found to have an acute lobar and segmental PE. She was then admitted for pain management in the setting of a submassive PE without right heart strain.   Submassive PE without right heart strain - Pain has improved along with tachycardia and tachypnea  - She is currently on heparin with plans to transition to Xarelto or Eliquis depending on cost - Case management consult for medication cost - Hypercoagulable panel pending  - Pain management with Ibuprofen 800 mg TID WC and Oxy IR 5mg  every 4 hours PRN - Close outpatient follow-up  with the internal medicine residency clinic   Dispo: Anticipated discharge today.   Levora Dredge, MD 09/21/2018, 7:44 AM

## 2018-09-21 NOTE — Progress Notes (Signed)
Call placed to [facility] to notify of telemetry monitoring d/c.   

## 2018-09-21 NOTE — Discharge Summary (Addendum)
Name: Tiffany Merritt MRN: [id number] DOB: 07-May-1986 32 y.o. PCP: Patient, No Pcp Per  Date of Admission: 09/20/2018  2:21 PM Date of Discharge: 09/21/18 Attending Physician: Anne Shutter, MD  Discharge Diagnosis: 1. Active Problems:   PE (pulmonary thromboembolism) (HCC) Submassive PE without evidence of right heart strain   Discharge Medications: Allergies as of 09/21/2018   No Known Allergies     Medication List    STOP taking these medications   azithromycin 250 MG tablet Commonly known as:  ZITHROMAX   doxycycline 100 MG capsule Commonly known as:  VIBRAMYCIN   predniSONE 10 MG tablet Commonly known as:  DELTASONE   promethazine-dextromethorphan 6.25-15 MG/5ML syrup Commonly known as:  PROMETHAZINE-DM     TAKE these medications   albuterol 108 (90 Base) MCG/ACT inhaler Commonly known as:  PROVENTIL HFA;VENTOLIN HFA Inhale into the lungs every 6 (six) hours as needed for wheezing or shortness of breath.   ibuprofen 800 MG tablet Commonly known as:  ADVIL,MOTRIN Take 1 tablet (800 mg total) by mouth 3 (three) times daily with meals as needed for moderate pain.   oxyCODONE 5 MG immediate release tablet Commonly known as:  Oxy IR/ROXICODONE Take 1 tablet (5 mg total) by mouth every 4 (four) hours as needed for moderate pain.   Rivaroxaban 15 MG Tabs tablet Commonly known as:  XARELTO Take 1 tablet (15 mg total) by mouth 2 (two) times daily with a meal.   rivaroxaban 20 MG Tabs tablet Commonly known as:  XARELTO Take 1 tablet (20 mg total) by mouth daily with supper. Start taking on:  10/12/2018       Disposition and follow-up:   [name] was discharged from [facility] in Stable condition.  At the hospital follow up visit please address:  1.  Patient is discharged with Xarelto, and plan for close follow-up at internal medicine clinic. Please follow-up hyper coag panel results. Patient is stable at discharge and  her pain improved.  Prescribed ibuprofen and oxycodone as needed for pain.    2.  Labs / imaging needed at time of follow-up: None  3.  Pending labs/ test needing follow-up: Hypercoag. panel results Follow-up Appointments: Follow-up Information    [facility] Follow up.   Why:  They will call you with an appointment. Contact information: [address] [place] [address] [phone]          Hospital Course by problem list: 1.  Ms. Tiffany Merritt is a 32 year old female with no significant past medical history, presented with 1 week history of progressive pleuritic chest pain and shortness of breath.   Initially went to urgent care and treated for pneumonia.  Not improved and came to ED. She was tachypneic and tachycardic arrival.  Normal O2 sats at 98% on room air.  Blood pressure normal.  Hypokalemia at 3.1 repleted EKG with T wave inversions in inferior leads.  Chest x-ray unremarkable.  found to have acute, submassive lobar segmental PE.  No right heart strain. Troponin came back negative.  Patient started with IV heparin and pain management. Her pain improved next day.   Risk factors: Patient is a smoker, denies OCP, history of previous clot or miscarriage. patient is adopted and has limited family history.  (?  SLE in mother ). Provoked versus unprovoked PE.  hyper coag panel sent and is pending plan to follow-up the results of patient.  At discharge, patient has mild pleuritichemodynamically stable  with normal physical exam.  We discharge her with Xarelto to be closely followed up in our [facility] clinic.  Discharge Vitals:   BP 113/66 (BP Location: Left Arm)   Pulse 80   Temp 97.8 F (36.6 C) (Oral)   Resp 18   Ht 5\' 4"  (1.626 m)   Wt 83.1 kg Comment: scale a  SpO2 98%   BMI 31.45 kg/m   Pertinent Labs, Studies, and Procedures:  I-STAT troponin: 0 hCG serum: Negative CT angios chest with contrast: 09/20/2018:  1. Positive for acute lobar and  segmental PE, RV/LV = 0.7. 2. 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   Discharge Instructions: Discharge Instructions    Call MD for:  difficulty breathing, headache or visual disturbances   Complete by:  As directed    Call MD for:  persistant dizziness or light-headedness   Complete by:  As directed    Diet - low sodium heart healthy   Complete by:  As directed    Increase activity slowly   Complete by:  As directed       Signed: Chevis Pretty, MD 09/21/2018, 8:46 AM   Pager: [phone]  Internal Medicine Attending Note:  I saw and examined the patient on the day of discharge. I reviewed and agree with the discharge summary written by the house staff.  Jessy Oto, M.D., Ph.D.

## 2018-09-22 LAB — PROTEIN C, TOTAL: Protein C, Total: 125 % (ref 60–150)

## 2018-09-22 LAB — CARDIOLIPIN ANTIBODIES, IGG, IGM, IGA
Anticardiolipin IgG: <9 GPL U/mL (ref 0–14)
Anticardiolipin IgM: <9 MPL U/mL (ref 0–12)

## 2018-09-22 LAB — HOMOCYSTEINE: HOMOCYSTEINE-NORM: 10.9 umol/L (ref 0.0–15.0)

## 2018-09-23 LAB — BETA-2-GLYCOPROTEIN I ABS, IGG/M/A
Beta-2 Glyco I IgG: <9 GPI IgG units (ref 0–20)
Beta-2-Glycoprotein I IgA: <9 GPI IgA units (ref 0–25)
Beta-2-Glycoprotein I IgM: 12 GPI IgM units (ref 0–32)

## 2018-09-23 LAB — PROTEIN C ACTIVITY: Protein C Activity: 139 % (ref 73–180)

## 2018-09-23 LAB — PROTEIN S, TOTAL: PROTEIN S AG TOTAL: 113 % (ref 60–150)

## 2018-09-23 LAB — PROTEIN S ACTIVITY: PROTEIN S ACTIVITY: 98 % (ref 63–140)

## 2018-09-25 LAB — DRVVT MIX: DRVVT MIX: 42 s (ref 0.0–47.0)

## 2018-09-25 LAB — LUPUS ANTICOAGULANT PANEL
DRVVT: 49.5 s — ABNORMAL HIGH (ref 0.0–47.0)
PTT Lupus Anticoagulant: 37.9 s (ref 0.0–51.9)

## 2018-09-26 LAB — PROTHROMBIN GENE MUTATION

## 2018-09-27 LAB — FACTOR 5 LEIDEN

## 2018-09-29 ENCOUNTER — Encounter: Payer: Self-pay | Admitting: Internal Medicine

## 2018-09-29 ENCOUNTER — Ambulatory Visit: Payer: 59 | Admitting: Internal Medicine

## 2018-09-29 ENCOUNTER — Other Ambulatory Visit: Payer: Self-pay

## 2018-09-29 VITALS — BP 126/67 | HR 89 | Temp 97.9°F | Ht 64.0 in | Wt 183.4 lb

## 2018-09-29 DIAGNOSIS — F17200 Nicotine dependence, unspecified, uncomplicated: Secondary | ICD-10-CM | POA: Diagnosis not present

## 2018-09-29 DIAGNOSIS — R202 Paresthesia of skin: Secondary | ICD-10-CM

## 2018-09-29 DIAGNOSIS — Z79899 Other long term (current) drug therapy: Secondary | ICD-10-CM | POA: Diagnosis not present

## 2018-09-29 DIAGNOSIS — Z7901 Long term (current) use of anticoagulants: Secondary | ICD-10-CM | POA: Diagnosis not present

## 2018-09-29 DIAGNOSIS — R12 Heartburn: Secondary | ICD-10-CM | POA: Diagnosis not present

## 2018-09-29 DIAGNOSIS — Z8742 Personal history of other diseases of the female genital tract: Secondary | ICD-10-CM | POA: Diagnosis not present

## 2018-09-29 DIAGNOSIS — R200 Anesthesia of skin: Secondary | ICD-10-CM | POA: Insufficient documentation

## 2018-09-29 DIAGNOSIS — Z975 Presence of (intrauterine) contraceptive device: Secondary | ICD-10-CM

## 2018-09-29 DIAGNOSIS — I2699 Other pulmonary embolism without acute cor pulmonale: Secondary | ICD-10-CM

## 2018-09-29 DIAGNOSIS — Z86711 Personal history of pulmonary embolism: Secondary | ICD-10-CM | POA: Diagnosis not present

## 2018-09-29 MED ORDER — OMEPRAZOLE 40 MG PO CPDR: 40.0000 mg | DELAYED_RELEASE_CAPSULE | ORAL | 1 refills | Status: AC | PRN

## 2018-09-29 NOTE — Progress Notes (Signed)
CC: establishing care   HPI:  [name] is a 32 y.o. with PMH as listed below who presents to establish care. Please see the assessment and plans for the status of the patient chronic medical problems.   Past Medical History:  Diagnosis Date  . Bipolar 2 disorder [facility])      Past Surgical History:  Procedure Laterality Date  . INCISION AND DRAINAGE      Social History   Socioeconomic History  . Marital status: Single    Spouse name: Not on file  . Number of children: Not on file  . Years of education: Not on file  . Highest education level: Not on file  Occupational History  . Not on file  Social Needs  . Financial resource strain: Not on file  . Food insecurity:    Worry: Not on file    Inability: Not on file  . Transportation needs:    Medical: Not on file    Non-medical: Not on file  Tobacco Use  . Smoking status: Current Every Day Smoker  . Smokeless tobacco: Never Used  Substance and Sexual Activity  . Alcohol use: Yes    Comment: occ  . Drug use: Never  . Sexual activity: Not on file  Lifestyle  . Physical activity:    Days per week: Not on file    Minutes per session: Not on file  . Stress: Not on file  Relationships  . Social connections:    Talks on phone: Not on file    Gets together: Not on file    Attends religious service: Not on file    Active member of club or organization: Not on file    Attends meetings of clubs or organizations: Not on file    Relationship status: Not on file  . Intimate partner violence:    Fear of current or ex partner: Not on file    Emotionally abused: Not on file    Physically abused: Not on file    Forced sexual activity: Not on file  Other Topics Concern  . Not on file  Social History Narrative  . Not on file   Family History  Problem Relation Age of Onset  . Lupus Mother   . COPD Maternal Grandmother    Review of Systems:  Refer to history of present illness and assessment and plans for  pertinent review of systems, all others reviewed and negative  Physical Exam:  Vitals:   09/29/18 1432  BP: 126/67  Pulse: 89  Temp: 97.9 F (36.6 C)  TempSrc: Oral  SpO2: 100%  Weight: 183 lb 6.4 oz (83.2 kg)  Height: 5\' 4"  (1.626 m)   General: well appearing, no acute distress  HEENT: no  Eyes: no scleral icterus, no  Cardiac: regular rate and rhythm, no murmurs, trace peripheral edema  Pulm: normal work of breathing, lungs are clear to auscultation  GI: abdomen is soft, non tender, non distended  MSK: upper and lower extremity proximal and distal muscle strength are intact   Assessment & Plan:   History of pulmonary embolism  Patient is here for pulmonary embolism hospital follow up. She was found to have an acute lobar and segmental pulmonary embolism without right heart strain and with signs of pulmonary infarction. and was transitioned from IV heparin to xarelto at the time of discharge. Her risk factors for PE include smoking history, she is adopted but believes her mother may have lupus. Hypercoag panel has resulted  with negative anticardiolipin antibody, negative beta 2 glycoprotein, negative factor 5 leiden, normal homocysteine. She has had no return of shortness of breath or pleuritic chest pain.  - return for follow up for D- dimer in 3 months, if this is negative there is an associated with lower risk of VTE reoccurence and we can discuss discontinuing anticoagulation.  - encouraged tobacco cessation  - continue xarelto   Heartburn  Describing symptoms of heartburn at night since returning home from hospital admission. She denies dysphagia, melena, BRBPR, weight loss. She denies waterbrash.  - start omeprazole 40 mg daily   Parasthesias  Describing symptoms of sporadic numbness and tingling over various areas of skin. These symptoms have affected her intermittently for years. She has no other symptoms or signs of systemic illness on exam. - will continue to encourage  tobacco cessation  - will continue to monitor   Preventive health  Patient is due for pap smear, last was five years ago. She has had an abnormal pap smear in the past, she is not sure what the results were but will try to obtain them. She request gynecology referral because she is due to change her IUD.  - referral to gynecology   See Encounters Tab for problem based charting.  Patient discussed with Dr. Criselda PeachesMullen

## 2018-09-29 NOTE — Assessment & Plan Note (Signed)
Describing symptoms of sporadic numbness and tingling over various areas of skin. These symptoms have affected her intermittently for years. She has no other symptoms or signs of systemic illness on exam. - will continue to encourage tobacco cessation  - will continue to monitor

## 2018-09-29 NOTE — Assessment & Plan Note (Signed)
Patient is due for pap smear, last was five years ago. She has had an abnormal pap smear in the past, she is not sure what the results were but will try to obtain them. She request gynecology referral because she is due to change her IUD.  - referral to gynecology

## 2018-09-29 NOTE — Patient Instructions (Addendum)
Thank you for coming to the clinic today. It was a pleasure to see you.   For your heartburn, try taking this omeprazole as needed, if your symptoms have not resolved in the next eight weeks please call us.   I have referred you to gynecology, if you haven't heard from someone about scheduling this office visit please give our office a call.   FOLLOW-UP INSTRUCTIONS When: 3 months with your new primary care provider   For: follow up blood work to evaluate your risk of having another blood clot  What to bring: all of your medication bottles   Please call the internal medicine center clinic if you have any questions or concerns, we may be able to help and keep you from a long and expensive emergency room wait. Our clinic and after hours phone number is [phone], the best time to call is Monday through Friday 9 am to 4 pm but there is always someone available 24/7 if you have an emergency. If you need medication refills please notify your pharmacy one week in advance and they will send us a request.

## 2018-09-29 NOTE — Assessment & Plan Note (Signed)
Describing symptoms of heartburn when she lies down in the evening that began since she returned home from hospital admission. She denies dysphagia, melena, BRBPR, weight loss. - start omeprazole 40 mg daily

## 2018-09-29 NOTE — Assessment & Plan Note (Signed)
Patient is here for pulmonary embolism hospital follow up. She was found to have an acute lobar and segmental pulmonary embolism without right heart strain and with signs of pulmonary infarction. and was transitioned from IV heparin to xarelto at the time of discharge. Her risk factors for PE include smoking history, she is adopted but believes her mother may have lupus. Hypercoag panel has resulted with negative anticardiolipin antibody, negative beta 2 glycoprotein, negative factor 5 leiden, normal homocysteine. She has had no return of shortness of breath or pleuritic chest pain.  - return for follow up for D- dimer in 3 months, if this is negative there is an associated with lower risk of VTE reoccurence and we can discuss discontinuing anticoagulation  - encouraged tobacco cessation

## 2018-10-04 NOTE — Progress Notes (Signed)
Internal Medicine Clinic Attending  Case discussed with Dr. Blum at the time of the visit.  We reviewed the resident's history and exam and pertinent patient test results.  I agree with the assessment, diagnosis, and plan of care documented in the resident's note. 

## 2018-10-10 ENCOUNTER — Emergency Department (HOSPITAL_COMMUNITY)
Admission: EM | Admit: 2018-10-10 | Discharge: 2018-10-10 | Disposition: A | Discharge status: Home / Self Care | Payer: 59 | Attending: Emergency Medicine | Admitting: Emergency Medicine

## 2018-10-10 ENCOUNTER — Other Ambulatory Visit: Payer: Self-pay

## 2018-10-10 ENCOUNTER — Encounter (HOSPITAL_COMMUNITY): Payer: Self-pay

## 2018-10-10 ENCOUNTER — Emergency Department (HOSPITAL_COMMUNITY): Payer: 59

## 2018-10-10 DIAGNOSIS — Z7901 Long term (current) use of anticoagulants: Secondary | ICD-10-CM | POA: Diagnosis not present

## 2018-10-10 DIAGNOSIS — N3090 Cystitis, unspecified without hematuria: Secondary | ICD-10-CM | POA: Insufficient documentation

## 2018-10-10 DIAGNOSIS — R410 Disorientation, unspecified: Secondary | ICD-10-CM | POA: Diagnosis not present

## 2018-10-10 DIAGNOSIS — Z79899 Other long term (current) drug therapy: Secondary | ICD-10-CM | POA: Diagnosis not present

## 2018-10-10 DIAGNOSIS — F17210 Nicotine dependence, cigarettes, uncomplicated: Secondary | ICD-10-CM | POA: Diagnosis not present

## 2018-10-10 DIAGNOSIS — R202 Paresthesia of skin: Secondary | ICD-10-CM | POA: Diagnosis not present

## 2018-10-10 DIAGNOSIS — R4182 Altered mental status, unspecified: Secondary | ICD-10-CM | POA: Diagnosis present

## 2018-10-10 LAB — URINALYSIS, ROUTINE W REFLEX MICROSCOPIC
BILIRUBIN URINE: NEGATIVE
Glucose, UA: NEGATIVE mg/dL
HGB URINE DIPSTICK: NEGATIVE
KETONES UR: 20 mg/dL — AB
Nitrite: NEGATIVE
PROTEIN: NEGATIVE mg/dL
SPECIFIC GRAVITY, URINE: 1.005 (ref 1.005–1.030)
pH: 6 (ref 5.0–8.0)

## 2018-10-10 LAB — COMPREHENSIVE METABOLIC PANEL
ALT: 16 U/L (ref 0–44)
AST: 18 U/L (ref 15–41)
Albumin: 4 g/dL (ref 3.5–5.0)
Alkaline Phosphatase: 47 U/L (ref 38–126)
Anion gap: 10 (ref 5–15)
BILIRUBIN TOTAL: 0.8 mg/dL (ref 0.3–1.2)
BUN: 6 mg/dL (ref 6–20)
CHLORIDE: 107 mmol/L (ref 98–111)
CO2: 22 mmol/L (ref 22–32)
Calcium: 9 mg/dL (ref 8.9–10.3)
Creatinine, Ser: 0.8 mg/dL (ref 0.44–1.00)
Glucose, Bld: 88 mg/dL (ref 70–99)
POTASSIUM: 4 mmol/L (ref 3.5–5.1)
Sodium: 139 mmol/L (ref 135–145)
TOTAL PROTEIN: 7.4 g/dL (ref 6.5–8.1)

## 2018-10-10 LAB — I-STAT BETA HCG BLOOD, ED (MC, WL, AP ONLY): I-stat hCG, quantitative: <5 m[IU]/mL (ref <5)

## 2018-10-10 LAB — CBC
HCT: 44.1 % (ref 36.0–46.0)
HEMOGLOBIN: 14.8 g/dL (ref 12.0–15.0)
MCH: 31.4 pg (ref 26.0–34.0)
MCHC: 33.6 g/dL (ref 30.0–36.0)
MCV: 93.6 fL (ref 80.0–100.0)
NRBC: 0 % (ref 0.0–0.2)
Platelets: 280 10*3/uL (ref 150–400)
RBC: 4.71 MIL/uL (ref 3.87–5.11)
RDW: 12.1 % (ref 11.5–15.5)
WBC: 7.8 10*3/uL (ref 4.0–10.5)

## 2018-10-10 LAB — RAPID URINE DRUG SCREEN, HOSP PERFORMED
Amphetamines: NOT DETECTED
Barbiturates: NOT DETECTED
Benzodiazepines: NOT DETECTED
Cocaine: NOT DETECTED
Opiates: NOT DETECTED
Tetrahydrocannabinol: NOT DETECTED

## 2018-10-10 LAB — CBG MONITORING, ED: GLUCOSE-CAPILLARY: 86 mg/dL (ref 70–99)

## 2018-10-10 MED ORDER — CEPHALEXIN 500 MG PO CAPS: 500.0000 mg | ORAL_CAPSULE | Freq: Two times a day (BID) | ORAL | 0 refills | Status: AC

## 2018-10-10 MED ORDER — SODIUM CHLORIDE 0.9 % IV BOLUS
1000.0000 mL | Freq: Once | INTRAVENOUS | Status: AC
  Administered 2018-10-10: 1000 mL via INTRAVENOUS

## 2018-10-10 MED ORDER — PROCHLORPERAZINE EDISYLATE 10 MG/2ML IJ SOLN
10.0000 mg | Freq: Once | INTRAMUSCULAR | Status: AC
  Administered 2018-10-10: 10 mg via INTRAVENOUS
  Filled 2018-10-10: qty 2

## 2018-10-10 MED ORDER — DIPHENHYDRAMINE HCL 50 MG/ML IJ SOLN
25.0000 mg | Freq: Once | INTRAMUSCULAR | Status: AC
  Administered 2018-10-10: 25 mg via INTRAVENOUS
  Filled 2018-10-10: qty 1

## 2018-10-10 MED ORDER — LORAZEPAM 2 MG/ML IJ SOLN
1.0000 mg | Freq: Once | INTRAMUSCULAR | Status: DC | PRN
  Filled 2018-10-10: qty 1

## 2018-10-10 MED ORDER — GADOBUTROL 1 MMOL/ML IV SOLN
7.0000 mL | Freq: Once | INTRAVENOUS | Status: AC | PRN
  Administered 2018-10-10: 7 mL via INTRAVENOUS

## 2018-10-10 MED ORDER — SODIUM CHLORIDE 0.9 % IV SOLN
Freq: Once | INTRAVENOUS | Status: AC
  Administered 2018-10-10: 19:00:00 via INTRAVENOUS

## 2018-10-10 NOTE — ED Triage Notes (Signed)
Pt to ED from work for acute onset of confusion and pins and needle feeling in arms and lips.  Pt states she was at work and started to not be able to think straight and remember what to do next.  States she does a repetitive job and should not be hard to remember what to do. Pt recently admitted for PE, on Xarelto for such, has not missed any doses. No unilateral numbness or heaviness. No focal neuro deficits.

## 2018-10-10 NOTE — ED Notes (Signed)
Pt CBG 86. Notified Katie, Charity fundraiserN.

## 2018-10-10 NOTE — Discharge Instructions (Signed)
Your lab tests were reassuring. Your MRI of the brain was normal. You were treated in the department with a migraine cocktail that you stated helped your symptoms.  I would like you to follow-up with outpatient neurology.  Please call tomorrow to schedule an appointment.  You will also appear to have a urinary tract infection which I am prescribing Keflex for.  Please see attached handouts. If you develop worsening or new concerning symptoms you can return to the emergency department for re-evaluation.

## 2018-10-10 NOTE — ED Provider Notes (Signed)
[facility] Provider Note   CSN: [id number] Arrival date & time: 10/10/18  1558     History   Chief Complaint Chief Complaint  Patient presents with  . Altered Mental Status  . Tingling    HPI Tiffany Merritt is a 32 y.o. female with a history of bipolar disorder and recent PE on 11/5 currently on xarelto presenting for confusion and numbness.  Patient reports that she awoke this morning in her normal state health.  She reports around 1:00 today she started feeling confused, forgetting things she would normally not.  She reports she started getting a tingling sensation around her bilateral upper lip, and her bilateral palms as well as in the soles of her feet.  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  She notes that she was continued having difficulties, forgetting simple things that she is supposed to do and is normally routine to her.  She reports that her numbness and tingling worsened and she began having heaviness of her bilateral upper legs so she decided to check herself in.  Patient reports she has had similar episodes of this in the past including confusion and paresthesias but has never had this before.  She does note when walking over to the room she felt dizzy as though the room was spinning.  This has since resolved.  The patient denies any head trauma or loss of consciousness since being started on Xarelto.  She denies any headache, eye pain, visual changes, diplopia, facial droop, difficulty with speech, slurred speech, tinnitus, syncope, presyncope, focal weakness, neck pain, chest pain, shortness breath, abdominal pain, nausea/vomiting/diarrhea, urinary symptoms.  Patient denies any new medications other than Xarelto.  She denies illicit drug use or over-the-counter medication use. No family history of MS. No family history of personal history of migraines.   HPI  Past Medical History:  Diagnosis Date  .  Bipolar 2 disorder [facility](HCC)     Patient Active Problem List   Diagnosis Date Noted  . Heartburn 09/29/2018  . Numbness and tingling 09/29/2018  . History of abnormal cervical Papanicolaou smear 09/29/2018  . PE (pulmonary thromboembolism) (HCC) 09/20/2018    Past Surgical History:  Procedure Laterality Date  . INCISION AND DRAINAGE       OB History   None      Home Medications    Prior to Admission medications   Medication Sig Start Date End Date Taking? Authorizing Provider  albuterol (PROVENTIL HFA;VENTOLIN HFA) 108 (90 Base) MCG/ACT inhaler Inhale into the lungs every 6 (six) hours as needed for wheezing or shortness of breath.    [provider]  ibuprofen (ADVIL,MOTRIN) 800 MG tablet Take 1 tablet (800 mg total) by mouth 3 (three) times daily with meals as needed for moderate pain. 09/21/18   Masoudi, Shawna OrleansElhamalsadat, MD  omeprazole (PRILOSEC) 40 MG capsule Take 1 capsule (40 mg total) by mouth as needed. 09/29/18   Eulah PontBlum, Nina, MD  oxyCODONE (OXY IR/ROXICODONE) 5 MG immediate release tablet Take 1 tablet (5 mg total) by mouth every 4 (four) hours as needed for moderate pain. 09/21/18   Masoudi, Shawna OrleansElhamalsadat, MD  Rivaroxaban (XARELTO) 15 MG TABS tablet Take 1 tablet (15 mg total) by mouth 2 (two) times daily with a meal. 09/21/18   Masoudi, Elhamalsadat, MD  rivaroxaban (XARELTO) 20 MG TABS tablet Take 1 tablet (20 mg total) by mouth daily with supper. 10/12/18   Chevis PrettyMasoudi, Elhamalsadat, MD    Family History  Family History  Problem Relation Age of Onset  . Lupus Mother   . COPD Maternal Grandmother     Social History Social History   Tobacco Use  . Smoking status: Current Every Day Smoker    Packs/day: 0.25    Years: 15.00    Pack years: 3.75    Types: Cigarettes  . Smokeless tobacco: Never Used  Substance Use Topics  . Alcohol use: Yes    Comment: occ  . Drug use: Never     Allergies   Patient has no known allergies.   Review of Systems Review of  Systems  All other systems reviewed and are negative.    Physical Exam Updated Vital Signs BP 138/84   Pulse 96   Temp 98.1 F (36.7 C) (Oral)   Resp 16   Ht 5\' 4"  (1.626 m)   Wt 83 kg   SpO2 98%   BMI 31.41 kg/m   Physical Exam  Constitutional: She appears well-developed and well-nourished.  HENT:  Head: Normocephalic and atraumatic.  Right Ear: External ear normal.  Left Ear: External ear normal.  Nose: Nose normal.  Mouth/Throat: Uvula is midline, oropharynx is clear and moist and mucous membranes are normal. No tonsillar exudate.  Eyes: Pupils are equal, round, and reactive to light. Right eye exhibits no discharge. Left eye exhibits no discharge. No scleral icterus.  Neck: Trachea normal. Neck supple. No spinous process tenderness present. No neck rigidity. Normal range of motion present.  Cardiovascular: Normal rate, regular rhythm and intact distal pulses.  No murmur heard. Pulses:      Radial pulses are 2+ on the right side, and 2+ on the left side.       Dorsalis pedis pulses are 2+ on the right side, and 2+ on the left side.       Posterior tibial pulses are 2+ on the right side, and 2+ on the left side.  Pulmonary/Chest: Effort normal and breath sounds normal. She exhibits no tenderness.  Abdominal: Soft. Bowel sounds are normal. There is no tenderness. There is no rebound and no guarding.  Musculoskeletal: She exhibits no edema.  Lymphadenopathy:    She has no cervical adenopathy.  Neurological: She is alert.  Mental Status:  Alert, oriented, thought content appropriate, able to give a coherent history. Speech fluent without evidence of aphasia. Able to follow 2 step commands without difficulty.  Cranial Nerves:  II:  Peripheral visual fields grossly normal, pupils equal, round, reactive to light.  No afferent pupillary defect III,IV, VI: ptosis not present, extra-ocular motions intact bilaterally.  1-2 beat horizontal nystagmus.  No vertical or rotary  nystagmus. V,VII: smile symmetric, eyebrows raise symmetric, facial light touch sensation equal VIII: hearing grossly normal to voice  X: uvula elevates symmetrically  XI: bilateral shoulder shrug symmetric and strong XII: midline tongue extension without fassiculations Motor:  Normal tone. 5/5 in upper and lower extremities bilaterally including strong and equal grip strength and dorsiflexion/plantar flexion Sensory: Sensation intact to light touch in all extremities. Negative Romberg.  Deep Tendon Reflexes: 2+ and symmetric in the biceps and patella Cerebellar: normal finger-to-nose with bilateral upper extremities. Normal heel-to -shin balance bilaterally of the lower extremity. No pronator drift.  Gait: normal gait and balance  CV: distal pulses palpable throughout   Skin: Skin is warm and dry. No rash noted. She is not diaphoretic.  Psychiatric: She has a normal mood and affect.  Nursing note and vitals reviewed.    ED Treatments / Results  Labs (all labs ordered are listed, but only abnormal results are displayed) Labs Reviewed  URINALYSIS, ROUTINE W REFLEX MICROSCOPIC - Abnormal; Notable for the following components:      Result Value   Ketones, ur 20 (*)    Leukocytes, UA MODERATE (*)    Bacteria, UA FEW (*)    All other components within normal limits  COMPREHENSIVE METABOLIC PANEL  CBC  RAPID URINE DRUG SCREEN, HOSP PERFORMED  CBG MONITORING, ED  I-STAT BETA HCG BLOOD, ED ([facility], [facility], [facility] ONLY)    EKG None  Radiology Mr Laqueta Jean And Wo Contrast  Result Date: 10/10/2018 CLINICAL DATA:  Facial and bilateral hand numbness and tingling. EXAM: MRI HEAD WITHOUT AND WITH CONTRAST TECHNIQUE: Multiplanar, multiecho pulse sequences of the brain and surrounding structures were obtained without and with intravenous contrast. CONTRAST:  7 mL Gadavist COMPARISON:  None. FINDINGS: BRAIN: There is no acute infarct, acute hemorrhage, hydrocephalus or extra-axial collection. The midline  structures are normal. No midline shift or other mass effect. There are no old infarcts. The white matter signal is normal for the patient's age. The cerebral and cerebellar volume are age-appropriate. Susceptibility-sensitive sequences show no chronic microhemorrhage or superficial siderosis. No abnormal contrast enhancement. VASCULAR: Major intracranial arterial and venous sinus flow voids are normal. SKULL AND UPPER CERVICAL SPINE: Calvarial bone marrow signal is normal. There is no skull base mass. Visualized upper cervical spine and soft tissues are normal. SINUSES/ORBITS: No fluid levels or advanced mucosal thickening. No mastoid or middle ear effusion. The orbits are normal. IMPRESSION: Normal MRI of the brain. Electronically Signed   By: Deatra Robinson M.D.   On: 10/10/2018 22:23    Procedures Procedures (including critical care time)  Medications Ordered in ED Medications  LORazepam (ATIVAN) injection 1 mg (has no administration in time range)  sodium chloride 0.9 % bolus 1,000 mL (0 mLs Intravenous Stopped 10/10/18 1834)  prochlorperazine (COMPAZINE) injection 10 mg (10 mg Intravenous Given 10/10/18 1922)  diphenhydrAMINE (BENADRYL) injection 25 mg (25 mg Intravenous Given 10/10/18 1922)  0.9 %  sodium chloride infusion ( Intravenous Stopped 10/10/18 2250)  gadobutrol (GADAVIST) 1 MMOL/ML injection 7 mL (7 mLs Intravenous Contrast Given 10/10/18 2214)     Initial Impression / Assessment and Plan / ED Course  I have reviewed the triage vital signs and the nursing notes.  Pertinent labs & imaging results that were available during my care of the patient were reviewed by me and considered in my medical decision making (see chart for details).     32 year old female presenting for confusion and onset of numbness to her lips, palms and soles around 1 PM today that worsened while at work.  Patient did note an episode of vertigo while walking that is since resolved.  She denies any  infectious symptoms.  No associated headache.  No history of migraines.  Family history of MS.  She denies any visual changes, difficulty with speech, facial droop, focal weakness.  Vital signs are reassuring on presentation.  Patient with normal neurologic exam.  No focal deficits.  Will obtain screening lab work.  Will consult neurology for further recommendations.  6:05 PM I discussed with Dr. Luisa Hart of neurology.  He recommends MRI w and wout contrast of the brain.  Suspect this may be a migraine and to treat as such.  I discussed this with patient and she is in agreement.  Screening blood work is reassuring.  UA does show evidence of UTI.  Patient will be  treated with Keflex.  MRI of the brain with and without contrast is negative.  Patient symptoms have resolved.  No further work-up indicated.  Will give outpatient neurology follow-up.  Return precautions discussed.  Patient appears safe for discharge.  Patient case discussed with Dr. Jacqulyn Bath who is in agreement with plan.  Final Clinical Impressions(s) / ED Diagnoses   Final diagnoses:  Confusion  Tingling  Cystitis    ED Discharge Orders         Ordered    cephALEXin (KEFLEX) 500 MG capsule  2 times daily     10/10/18 2238           Princella Pellegrini 10/10/18 2342    Azalia Bilis, MD 10/19/18 [phone]

## 2018-10-11 ENCOUNTER — Telehealth: Payer: Self-pay | Admitting: *Deleted

## 2018-10-11 MED FILL — CEPHALEXIN 500 MG CAPSULE: 500 | 5 days supply | Qty: 10 | Fill #0

## 2018-10-11 MED FILL — XARELTO 20 MG TABLET: 20 | 30 days supply | Qty: 30 | Fill #0

## 2018-10-11 MED FILL — OMEPRAZOLE 40 MG CPDR: 40 | 30 days supply | Qty: 30 | Fill #0

## 2018-10-11 NOTE — Telephone Encounter (Signed)
[facility] Sig. 1 tab as needed. #30. Clarified that this should be 1 tab daily as needed. Please contact MaryAnn at [phone] if this is incorrect. Thanks. Kinnie FeilL. Ducatte, RN, BSN

## 2018-10-12 NOTE — Telephone Encounter (Signed)
Thank you that is correct  

## 2018-10-17 ENCOUNTER — Encounter: Payer: Self-pay | Admitting: Speech-Language Pathologist

## 2018-10-24 ENCOUNTER — Encounter: Payer: Self-pay | Admitting: Internal Medicine

## 2018-10-24 ENCOUNTER — Ambulatory Visit: Payer: 59 | Admitting: Internal Medicine

## 2018-10-24 VITALS — BP 121/72 | HR 91 | Temp 98.3°F | Ht 64.0 in | Wt 181.1 lb

## 2018-10-24 DIAGNOSIS — F419 Anxiety disorder, unspecified: Secondary | ICD-10-CM

## 2018-10-24 DIAGNOSIS — Z7901 Long term (current) use of anticoagulants: Secondary | ICD-10-CM | POA: Diagnosis not present

## 2018-10-24 DIAGNOSIS — Z86711 Personal history of pulmonary embolism: Secondary | ICD-10-CM | POA: Diagnosis not present

## 2018-10-24 DIAGNOSIS — B373 Candidiasis of vulva and vagina: Secondary | ICD-10-CM

## 2018-10-24 DIAGNOSIS — Z79899 Other long term (current) drug therapy: Secondary | ICD-10-CM

## 2018-10-24 DIAGNOSIS — I2699 Other pulmonary embolism without acute cor pulmonale: Secondary | ICD-10-CM

## 2018-10-24 DIAGNOSIS — B379 Candidiasis, unspecified: Secondary | ICD-10-CM

## 2018-10-24 MED ORDER — HYDROXYZINE HCL 10 MG PO TABS: 10.0000 mg | ORAL_TABLET | Freq: Three times a day (TID) | ORAL | 0 refills | Status: AC | PRN

## 2018-10-24 MED ORDER — RIVAROXABAN 20 MG PO TABS: 20.0000 mg | ORAL_TABLET | Freq: Every day | ORAL | 0 refills | Status: AC

## 2018-10-24 MED ORDER — FLUCONAZOLE 150 MG PO TABS: 150.0000 mg | ORAL_TABLET | Freq: Once | ORAL | 0 refills | Status: AC

## 2018-10-24 MED FILL — FLUCONAZOLE 150 MG TABS: 150 | 1 days supply | Qty: 1 | Fill #0

## 2018-10-24 NOTE — Progress Notes (Signed)
   CC: Pulmonary Embolus, Medication questions  HPI:  Tiffany Merritt is a 32 y.o. F with PMHx listed below presenting for Pulmonary Embolus, Medication questions. Please see the A&P for the status of the patient's chronic medical problems.   Past Medical History:  Diagnosis Date  . Bipolar 2 disorder (HCC)    Review of Systems:  Performed and all others negative.  Physical Exam:  Vitals:   10/24/18 1337  BP: 121/72  Pulse: 91  Temp: 98.3 F (36.8 C)  TempSrc: Oral  SpO2: 99%  Weight: 181 lb 1.6 oz (82.1 kg)  Height: 5\' 4"  (1.626 m)   Physical Exam  Constitutional: She appears well-developed and well-nourished. No distress.  Cardiovascular: Normal rate, regular rhythm, normal heart sounds and intact distal pulses.  Pulmonary/Chest: Effort normal and breath sounds normal. No respiratory distress.  Abdominal: Soft. Bowel sounds are normal. She exhibits no distension. There is no tenderness.  Musculoskeletal: She exhibits no edema or deformity.  Skin: Skin is warm and dry.     Assessment & Plan:   See Encounters Tab for problem based charting.  Patient discussed with Dr. Sandre Kittyaines

## 2018-10-24 NOTE — Patient Instructions (Addendum)
Thank you for allowing us to care for you  For your history of DVT/PE - We will order a 90 day refill of your Xarelto - You are to follow up with a doctor in february for a d-dimer test > If that test is negative you can discuss stopping anticoagulation > If that test is positive you will need to continue anticoagulation for at least an additional 3 months  For your Yeast infection we have ordered a dose of fluconazole for you  Please follow up with a doctor in [place] and send us their information so we can forward your records.

## 2018-10-25 ENCOUNTER — Encounter: Payer: Self-pay | Admitting: Internal Medicine

## 2018-10-25 DIAGNOSIS — B379 Candidiasis, unspecified: Secondary | ICD-10-CM | POA: Insufficient documentation

## 2018-10-25 DIAGNOSIS — F419 Anxiety disorder, unspecified: Secondary | ICD-10-CM | POA: Insufficient documentation

## 2018-10-25 MED FILL — hydrOXYzine HCL 10 MG TABS: 10 | 1 days supply | Qty: 2 | Fill #0

## 2018-10-25 NOTE — Progress Notes (Signed)
Internal Medicine Clinic Attending  I saw and evaluated the patient.  I personally confirmed the key portions of the history and exam documented by Dr. Alinda MoneyMelvin and I reviewed pertinent patient test results.  The assessment, diagnosis, and plan were formulated together and I agree with the documentation in the resident's note.  Moving back to [place], provided 90 day supply of rivaroxaban. I reinforced the necessity of finding a PCP there, then having them obtain records from us. At this time, apparently unprovoked PE with uncertain duration of anticoagulation.   Jessy OtoAlexander Raines, M.D., Ph.D.

## 2018-10-25 NOTE — Assessment & Plan Note (Signed)
Patient reports vaginal itching and discharge consistent with Yeast infection after taking antibiotics for ?UTI at a recent ED visit. She states the symptoms are consistent with her previous Yeast infection and that she almost always gets one after taking antibiotics. Will treat empirically. - Fluconazole 150mg , Once

## 2018-10-25 NOTE — Assessment & Plan Note (Signed)
Patient plans to fly home to [place] this Friday (and remain there for the foreseeable future) and she has question about her history of blood clots and medications. She was informed it is safe to fly as she is on her preventative dose of anticoagulation. She will also be provided with a 90 day supply to allow her time to find a PCP in [place], who she needs to see to discuss continuation of her anticoagulation by February 2020. She was instructed to get a release of information form, so we would be able to send the neccessary records when she finds a doctor. - Refill Xarelto 20mg  Daily for 90 day supply

## 2018-10-25 NOTE — Assessment & Plan Note (Signed)
Patient states she has anxiety related to flying and she is taking a flight to [place] on Friday. She requests something to help with her anxiety.  - Hydroxyzine 10mg  TID, PRN Anxiety, #2

## 2018-11-04 MED FILL — XARELTO 20 MG TABLET: 20 | 90 days supply | Qty: 90 | Fill #0

## 2018-11-25 NOTE — Addendum Note (Signed)
Addended by: Neomia Dear on: 11/25/2018 07:13 PM   Modules accepted: Orders

## 2018-12-29 ENCOUNTER — Encounter: Payer: 59 | Admitting: Internal Medicine

## 2020-06-24 IMAGING — CT CT ANGIO CHEST
2 of 6 series · 18 of 36 positions shown · IV contrast (iopamidol)
Comparison: 08/19/2018 chest radiograph

CLINICAL DATA: 32 y/o F; chest pain with 1 week of shortness of
breath.

EXAM:
CT ANGIOGRAPHY CHEST WITH CONTRAST
TECHNIQUE: Multidetector CT imaging of the chest was performed using the
standard protocol during bolus administration of intravenous
contrast. Multiplanar CT image reconstructions and MIPs were
obtained to evaluate the vascular anatomy.
CONTRAST:  100mL PYQQSX-8P8 IOPAMIDOL (PYQQSX-8P8) INJECTION 76%

[Series 7: pe thins · axial · 0.83mm/px · z∈[+1141,+1416]mm · 17 of 437 slices shown]
[im 22/437  lung]
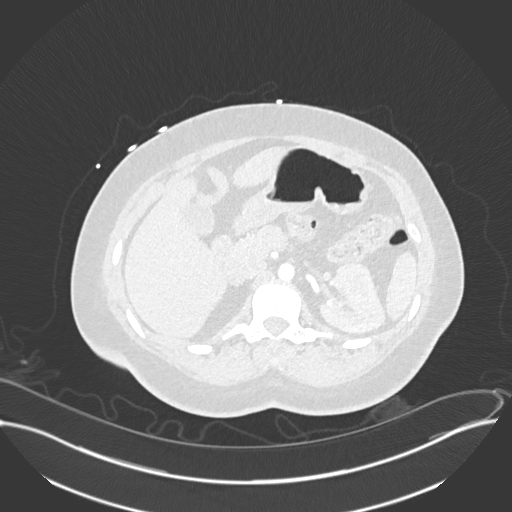
[im 44/437  mediastinal]
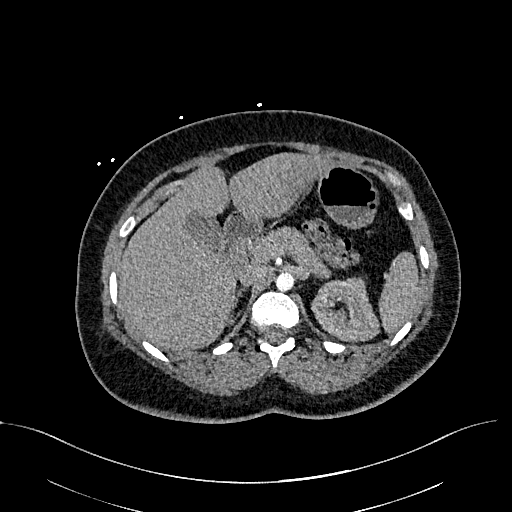
[im 66/437  lung]
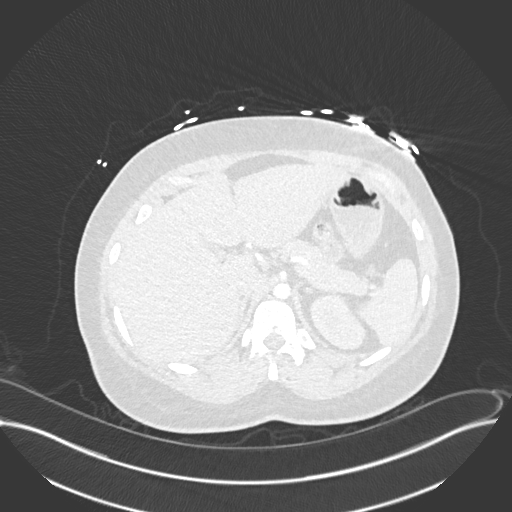
[im 88/437  mediastinal]
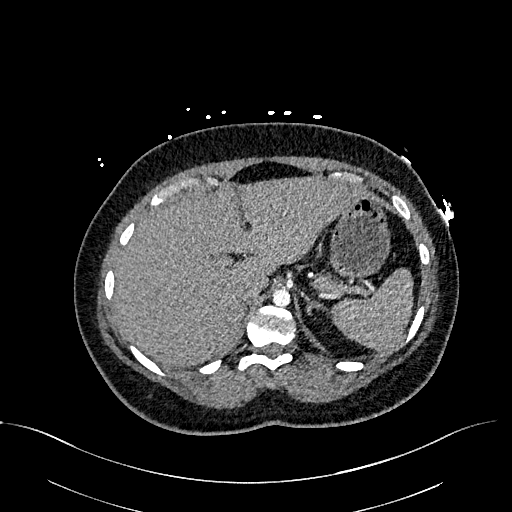
[im 131/437  lung]
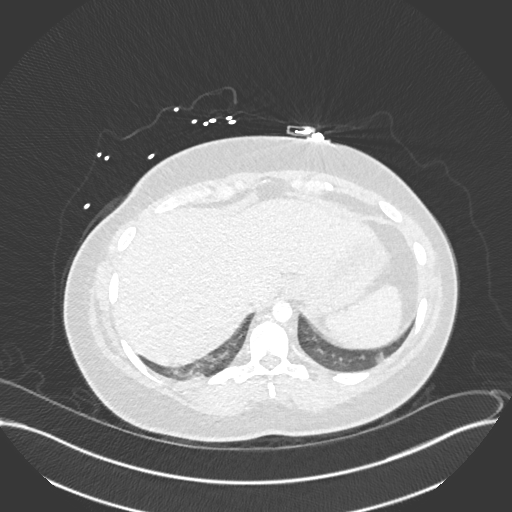
[im 153/437  mediastinal]
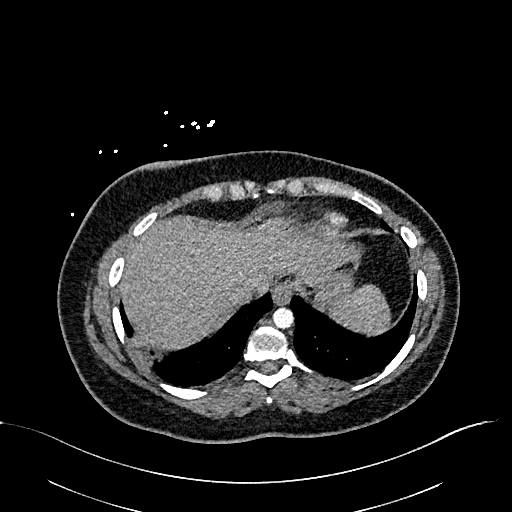
[im 175/437  lung]
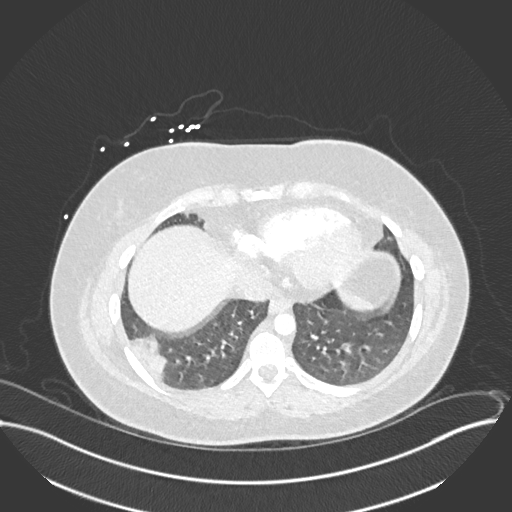
[im 197/437  mediastinal]
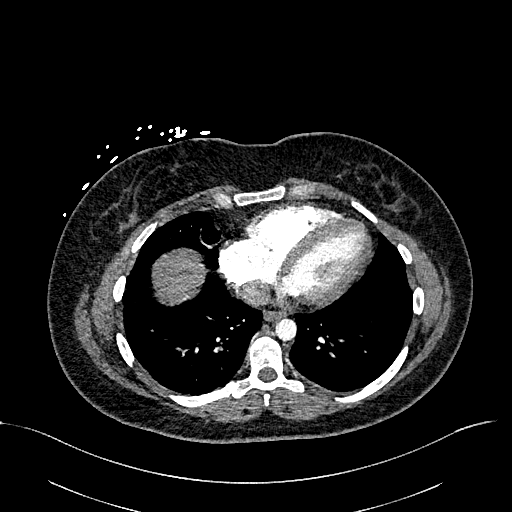
[im 219/437  lung]
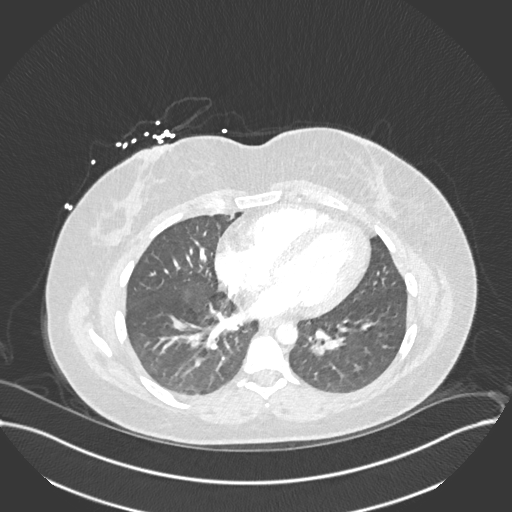
[im 240/437  mediastinal]
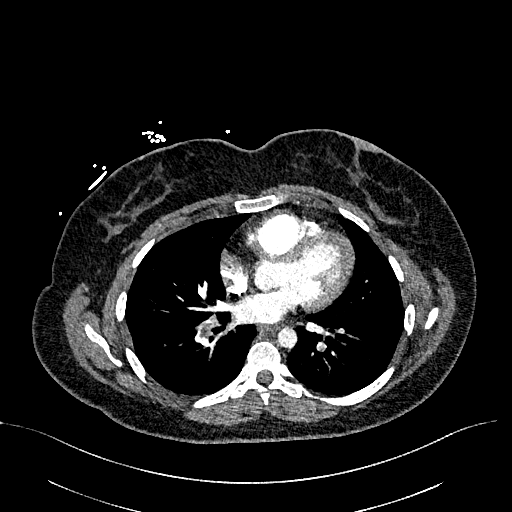
[im 262/437  lung]
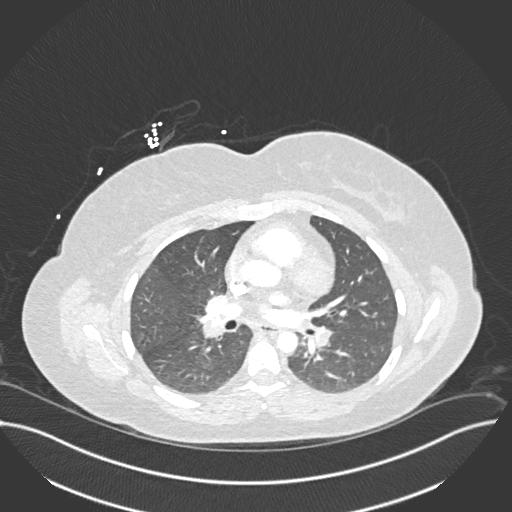
[im 284/437  mediastinal]
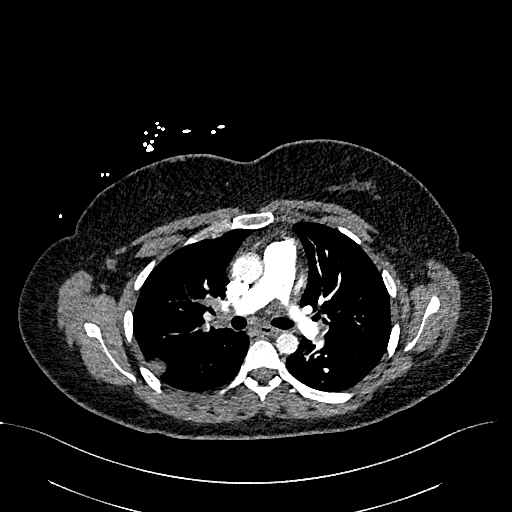
[im 306/437  lung]
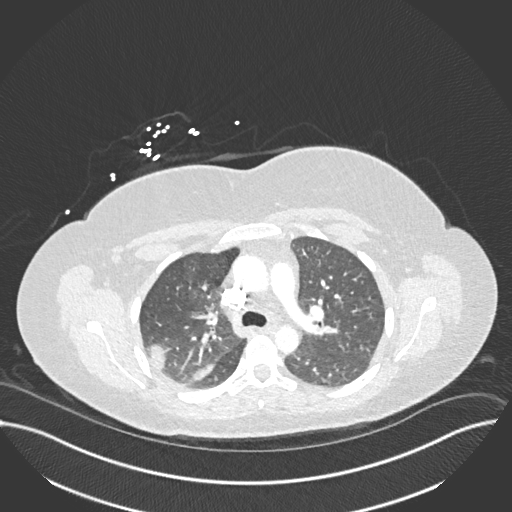
[im 349/437  mediastinal]
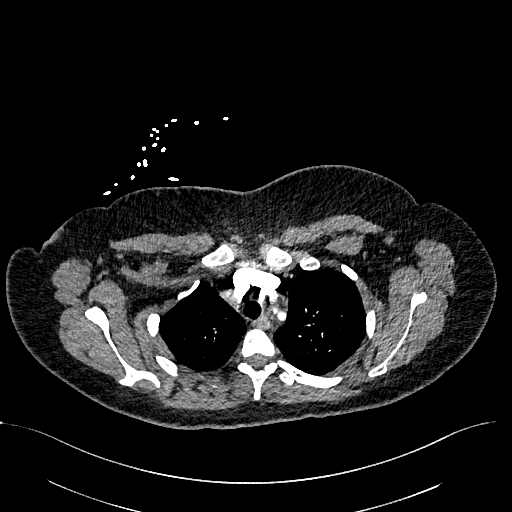
[im 371/437  lung]
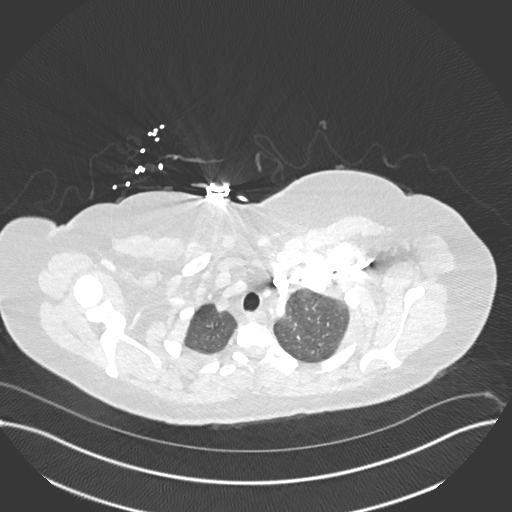
[im 393/437  mediastinal]
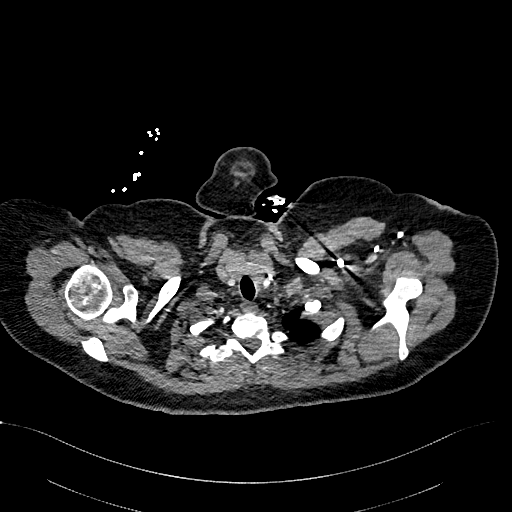
[im 415/437  lung]
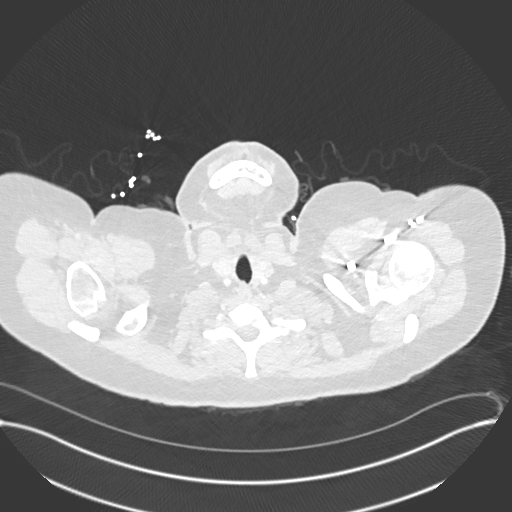

[Series 8: pe 2mm cor · coronal · 0.60mm/px · 1 of 149 slices shown]
[im 75/149  mediastinal]
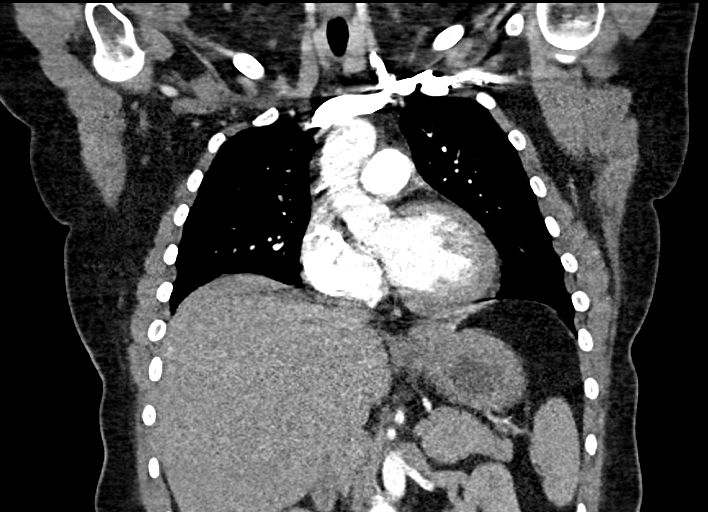

[18 of 36 positions shown; findings below may reference images not displayed]

FINDINGS: Cardiovascular: Acute lobar and segmental pulmonary emboli
bilaterally. RV/LV = 0.7. Normal heart size. No pericardial
effusion. Normal caliber thoracic aorta.

Mediastinum/Nodes: No enlarged mediastinal, hilar, or axillary lymph
nodes. Thyroid gland, trachea, and esophagus demonstrate no
significant findings.

Lungs/Pleura: Peripheral consolidations in the right upper lobe and
right lower lobe with central lucency. No pleural effusion or
pneumothorax.

Upper Abdomen: No acute abnormality.

Musculoskeletal: No chest wall abnormality. No acute or significant
osseous findings.

Review of the MIP images confirms the above findings.
IMPRESSION: 1. Positive for acute lobar and segmental PE, RV/LV = 0.7.
2. Small peripheral consolidations in the right upper lobe and right
lower lobe with central lucency, likely pulmonary infarcts.

Critical Value/emergent results were called by telephone at the time
of interpretation on 09/20/2018 at [DATE] to Dr. BLADE AUJLA ,
who verbally acknowledged these results.
